# Patient Record
Sex: Female | Born: 1935 | Race: White | Hispanic: No | Smoking: Former smoker
Health system: Southern US, Community
[De-identification: ages and names within clinical notes are randomized; demographics above are authoritative.]

## PROBLEM LIST (undated history)

## (undated) DIAGNOSIS — I2510 Atherosclerotic heart disease of native coronary artery without angina pectoris: Secondary | ICD-10-CM

## (undated) DIAGNOSIS — K219 Gastro-esophageal reflux disease without esophagitis: Secondary | ICD-10-CM

## (undated) DIAGNOSIS — C801 Malignant (primary) neoplasm, unspecified: Secondary | ICD-10-CM

## (undated) DIAGNOSIS — H409 Unspecified glaucoma: Secondary | ICD-10-CM

## (undated) HISTORY — PX: TONSILLECTOMY: SUR1361

## (undated) HISTORY — PX: ABDOMINAL HYSTERECTOMY: SHX81

## (undated) HISTORY — PX: CHOLECYSTECTOMY: SHX55

---

## 2004-10-03 ENCOUNTER — Ambulatory Visit: Payer: Self-pay | Admitting: Internal Medicine

## 2005-05-18 ENCOUNTER — Emergency Department: Payer: Self-pay | Admitting: Emergency Medicine

## 2005-05-18 ENCOUNTER — Other Ambulatory Visit: Payer: Self-pay

## 2005-10-21 ENCOUNTER — Ambulatory Visit: Payer: Self-pay | Admitting: Internal Medicine

## 2006-04-09 ENCOUNTER — Ambulatory Visit: Payer: Self-pay | Admitting: Ophthalmology

## 2006-04-15 ENCOUNTER — Ambulatory Visit: Payer: Self-pay | Admitting: Ophthalmology

## 2006-10-27 ENCOUNTER — Ambulatory Visit: Payer: Self-pay | Admitting: Internal Medicine

## 2007-07-16 ENCOUNTER — Ambulatory Visit: Payer: Self-pay | Admitting: Family Medicine

## 2007-10-28 ENCOUNTER — Ambulatory Visit: Payer: Self-pay | Admitting: Internal Medicine

## 2008-09-14 ENCOUNTER — Ambulatory Visit: Payer: Self-pay | Admitting: Family Medicine

## 2008-10-19 ENCOUNTER — Ambulatory Visit: Payer: Self-pay | Admitting: Unknown Physician Specialty

## 2008-10-23 ENCOUNTER — Ambulatory Visit: Payer: Self-pay | Admitting: Unknown Physician Specialty

## 2008-10-31 ENCOUNTER — Ambulatory Visit: Payer: Self-pay | Admitting: Unknown Physician Specialty

## 2008-11-08 ENCOUNTER — Ambulatory Visit: Payer: Self-pay | Admitting: Surgery

## 2008-11-14 ENCOUNTER — Ambulatory Visit: Payer: Self-pay | Admitting: Internal Medicine

## 2008-11-15 ENCOUNTER — Inpatient Hospital Stay: Payer: Self-pay | Admitting: Surgery

## 2008-11-22 ENCOUNTER — Ambulatory Visit: Payer: Self-pay | Admitting: Surgery

## 2008-11-27 ENCOUNTER — Inpatient Hospital Stay: Payer: Self-pay | Admitting: Surgery

## 2008-12-13 ENCOUNTER — Inpatient Hospital Stay: Payer: Self-pay | Admitting: Surgery

## 2009-11-20 ENCOUNTER — Ambulatory Visit: Payer: Self-pay | Admitting: Internal Medicine

## 2010-05-09 ENCOUNTER — Ambulatory Visit: Payer: Self-pay

## 2010-12-11 ENCOUNTER — Ambulatory Visit: Payer: Self-pay | Admitting: Internal Medicine

## 2011-02-04 ENCOUNTER — Ambulatory Visit: Payer: Self-pay | Admitting: Unknown Physician Specialty

## 2011-02-10 ENCOUNTER — Ambulatory Visit: Payer: Self-pay | Admitting: Unknown Physician Specialty

## 2011-05-20 ENCOUNTER — Emergency Department: Payer: Self-pay | Admitting: Emergency Medicine

## 2011-05-20 LAB — COMPREHENSIVE METABOLIC PANEL
Albumin: 4 g/dL (ref 3.4–5.0)
Alkaline Phosphatase: 80 U/L (ref 50–136)
Anion Gap: 14 (ref 7–16)
BUN: 20 mg/dL — ABNORMAL HIGH (ref 7–18)
Bilirubin,Total: 0.4 mg/dL (ref 0.2–1.0)
Calcium, Total: 8.7 mg/dL (ref 8.5–10.1)
Chloride: 105 mmol/L (ref 98–107)
Co2: 21 mmol/L (ref 21–32)
Creatinine: 0.71 mg/dL (ref 0.60–1.30)
EGFR (Non-African Amer.): >60
Osmolality: 282 (ref 275–301)
SGPT (ALT): 46 U/L
Sodium: 140 mmol/L (ref 136–145)
Total Protein: 7.1 g/dL (ref 6.4–8.2)

## 2011-05-20 LAB — CBC
HCT: 38.9 % (ref 35.0–47.0)
HGB: 12.9 g/dL (ref 12.0–16.0)
MCV: 90 fL (ref 80–100)
Platelet: 180 10*3/uL (ref 150–440)
RDW: 14.1 % (ref 11.5–14.5)
WBC: 15 10*3/uL — ABNORMAL HIGH (ref 3.6–11.0)

## 2011-05-20 LAB — URINALYSIS, COMPLETE
Bilirubin,UR: NEGATIVE
Glucose,UR: NEGATIVE mg/dL (ref 0–75)
Ketone: NEGATIVE
RBC,UR: 2105 /HPF (ref 0–5)
Specific Gravity: 1.009 (ref 1.003–1.030)

## 2011-05-20 LAB — CK TOTAL AND CKMB (NOT AT ARMC): CK, Total: 744 U/L — ABNORMAL HIGH (ref 21–215)

## 2011-05-30 DIAGNOSIS — S62339A Displaced fracture of neck of unspecified metacarpal bone, initial encounter for closed fracture: Secondary | ICD-10-CM | POA: Insufficient documentation

## 2011-06-05 DIAGNOSIS — S12100A Unspecified displaced fracture of second cervical vertebra, initial encounter for closed fracture: Secondary | ICD-10-CM | POA: Insufficient documentation

## 2011-06-26 IMAGING — NM NUCLEAR MEDICINE HEPATOHBILIARY INCLUDE GB
2 series · 13 of 13 positions shown · non-contrast
Comparison: none

REASON FOR EXAM: abd pain   nausea   vomiting
COMMENTS:

PROCEDURE:     NM  - NM HEPATOBILIARY IMAGE  - October 23, 2008 [DATE]
RESULT:
TECHNIQUE: Hepatobiliary evaluation was performed status post intravenous
administration, left antecubital, of 8.14 mCi of Technetium 99m labeled
Choletec. Standard imaging of the abdomen was obtained with focus upon the
liver.

[Series 1000: gallbladder statics · 4.80mm/px · 2 of 2 slices shown]
[im 1/2]
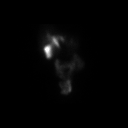
[im 2/2]
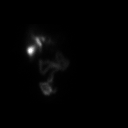

[Series 1000: gallbladder statics (concatenated) · 4.80mm/px · 11 of 11 slices shown]
[im 1/11]
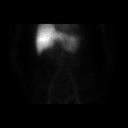
[im 2/11]
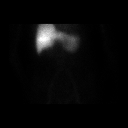
[im 3/11]
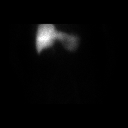
[im 4/11]
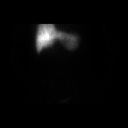
[im 5/11]
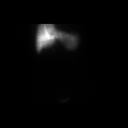
[im 6/11]
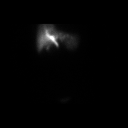
[im 7/11]
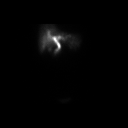
[im 8/11]
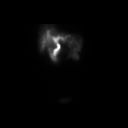
[im 9/11]
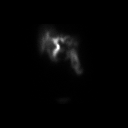
[im 10/11]
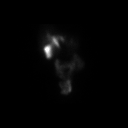
[im 11/11]
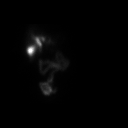

[13 of 13 positions shown; findings below may reference images not displayed]

FINDINGS: There is diffuse radiotracer activity throughout the liver. Common
bile duct activity is identified at 30 minutes with increased activity
appreciated. Small bowel activity is identified at 50 minutes. Gallbladder
excretion is identified at 80 minutes. Increased activity is identified
within the remaining portions of the study as well as peristalsis within
small bowel.
IMPRESSION: Unremarkable hepatobiliary evaluation as described above.

## 2012-11-15 ENCOUNTER — Ambulatory Visit: Payer: Self-pay | Admitting: Family Medicine

## 2013-11-10 DIAGNOSIS — K219 Gastro-esophageal reflux disease without esophagitis: Secondary | ICD-10-CM | POA: Insufficient documentation

## 2014-03-31 ENCOUNTER — Ambulatory Visit: Payer: Self-pay | Admitting: Family Medicine

## 2014-05-18 ENCOUNTER — Ambulatory Visit: Payer: Self-pay | Admitting: Family Medicine

## 2014-12-29 ENCOUNTER — Emergency Department: Payer: Medicare Other

## 2014-12-29 ENCOUNTER — Inpatient Hospital Stay
Admission: EM | Admit: 2014-12-29 | Discharge: 2015-01-01 | DRG: 389 | Disposition: A | Discharge status: Home / Self Care | Payer: Medicare Other | Attending: Surgery | Admitting: Surgery

## 2014-12-29 DIAGNOSIS — Z800 Family history of malignant neoplasm of digestive organs: Secondary | ICD-10-CM

## 2014-12-29 DIAGNOSIS — B961 Klebsiella pneumoniae [K. pneumoniae] as the cause of diseases classified elsewhere: Secondary | ICD-10-CM | POA: Diagnosis present

## 2014-12-29 DIAGNOSIS — N390 Urinary tract infection, site not specified: Secondary | ICD-10-CM | POA: Diagnosis present

## 2014-12-29 DIAGNOSIS — I2510 Atherosclerotic heart disease of native coronary artery without angina pectoris: Secondary | ICD-10-CM | POA: Diagnosis present

## 2014-12-29 DIAGNOSIS — H409 Unspecified glaucoma: Secondary | ICD-10-CM | POA: Diagnosis present

## 2014-12-29 DIAGNOSIS — Z882 Allergy status to sulfonamides status: Secondary | ICD-10-CM

## 2014-12-29 DIAGNOSIS — Z87891 Personal history of nicotine dependence: Secondary | ICD-10-CM | POA: Diagnosis not present

## 2014-12-29 DIAGNOSIS — R1110 Vomiting, unspecified: Secondary | ICD-10-CM

## 2014-12-29 DIAGNOSIS — K5669 Other intestinal obstruction: Secondary | ICD-10-CM | POA: Diagnosis present

## 2014-12-29 DIAGNOSIS — K219 Gastro-esophageal reflux disease without esophagitis: Secondary | ICD-10-CM | POA: Diagnosis present

## 2014-12-29 DIAGNOSIS — K5790 Diverticulosis of intestine, part unspecified, without perforation or abscess without bleeding: Secondary | ICD-10-CM | POA: Diagnosis present

## 2014-12-29 DIAGNOSIS — R109 Unspecified abdominal pain: Secondary | ICD-10-CM

## 2014-12-29 DIAGNOSIS — K56609 Unspecified intestinal obstruction, unspecified as to partial versus complete obstruction: Secondary | ICD-10-CM | POA: Diagnosis present

## 2014-12-29 HISTORY — DX: Unspecified glaucoma: H40.9

## 2014-12-29 HISTORY — DX: Malignant (primary) neoplasm, unspecified: C80.1

## 2014-12-29 HISTORY — DX: Gastro-esophageal reflux disease without esophagitis: K21.9

## 2014-12-29 HISTORY — DX: Atherosclerotic heart disease of native coronary artery without angina pectoris: I25.10

## 2014-12-29 LAB — URINALYSIS COMPLETE WITH MICROSCOPIC (ARMC ONLY)
BILIRUBIN URINE: NEGATIVE
GLUCOSE, UA: NEGATIVE mg/dL
KETONES UR: NEGATIVE mg/dL
NITRITE: NEGATIVE
PH: 5 (ref 5.0–8.0)
Protein, ur: NEGATIVE mg/dL
SPECIFIC GRAVITY, URINE: 1.01 (ref 1.005–1.030)

## 2014-12-29 LAB — CBC
HCT: 41.7 % (ref 35.0–47.0)
Hemoglobin: 13.7 g/dL (ref 12.0–16.0)
MCH: 29.4 pg (ref 26.0–34.0)
MCHC: 32.8 g/dL (ref 32.0–36.0)
MCV: 89.6 fL (ref 80.0–100.0)
PLATELETS: 223 10*3/uL (ref 150–440)
RBC: 4.66 MIL/uL (ref 3.80–5.20)
RDW: 14 % (ref 11.5–14.5)
WBC: 9.1 10*3/uL (ref 3.6–11.0)

## 2014-12-29 LAB — COMPREHENSIVE METABOLIC PANEL
ALT: 21 U/L (ref 14–54)
AST: 23 U/L (ref 15–41)
Albumin: 4.1 g/dL (ref 3.5–5.0)
Alkaline Phosphatase: 115 U/L (ref 38–126)
Anion gap: 9 (ref 5–15)
BILIRUBIN TOTAL: 0.6 mg/dL (ref 0.3–1.2)
BUN: 15 mg/dL (ref 6–20)
CHLORIDE: 107 mmol/L (ref 101–111)
CO2: 24 mmol/L (ref 22–32)
CREATININE: 0.82 mg/dL (ref 0.44–1.00)
Calcium: 9.3 mg/dL (ref 8.9–10.3)
Glucose, Bld: 126 mg/dL — ABNORMAL HIGH (ref 65–99)
POTASSIUM: 3.8 mmol/L (ref 3.5–5.1)
Sodium: 140 mmol/L (ref 135–145)
TOTAL PROTEIN: 7.2 g/dL (ref 6.5–8.1)

## 2014-12-29 LAB — LIPASE, BLOOD: LIPASE: 35 U/L (ref 11–51)

## 2014-12-29 MED ORDER — LATANOPROST 0.005 % OP SOLN
1.0000 [drp] | Freq: Every day | OPHTHALMIC | Status: DC
  Filled 2014-12-29: qty 2.5

## 2014-12-29 MED ORDER — ZOLPIDEM TARTRATE 5 MG PO TABS: 5.0000 mg | ORAL_TABLET | Freq: Every evening | ORAL | Status: DC | PRN

## 2014-12-29 MED ORDER — VERAPAMIL HCL ER 180 MG PO TBCR
90.0000 mg | EXTENDED_RELEASE_TABLET | Freq: Two times a day (BID) | ORAL | Status: DC
  Administered 2014-12-30 – 2015-01-01 (×6): 90 mg via ORAL
  Filled 2014-12-29 (×6): qty 0.5
  Filled 2014-12-29 (×2): qty 1

## 2014-12-29 MED ORDER — IOHEXOL 300 MG/ML  SOLN
100.0000 mL | Freq: Once | INTRAMUSCULAR | Status: AC | PRN
  Administered 2014-12-29: 100 mL via INTRAVENOUS

## 2014-12-29 MED ORDER — DORZOLAMIDE HCL 2 % OP SOLN
1.0000 [drp] | Freq: Every day | OPHTHALMIC | Status: DC
  Filled 2014-12-29: qty 10

## 2014-12-29 MED ORDER — ACETAMINOPHEN 325 MG PO TABS: 650.0000 mg | ORAL_TABLET | Freq: Four times a day (QID) | ORAL | Status: DC | PRN

## 2014-12-29 MED ORDER — ONDANSETRON 4 MG PO TBDP: 4.0000 mg | ORAL_TABLET | Freq: Four times a day (QID) | ORAL | Status: DC | PRN

## 2014-12-29 MED ORDER — KCL IN DEXTROSE-NACL 20-5-0.45 MEQ/L-%-% IV SOLN: Freq: Once | INTRAVENOUS | Status: DC

## 2014-12-29 MED ORDER — BRIMONIDINE TARTRATE-TIMOLOL 0.2-0.5 % OP SOLN: 1.0000 [drp] | Freq: Every day | OPHTHALMIC | Status: DC

## 2014-12-29 MED ORDER — IOHEXOL 240 MG/ML SOLN
25.0000 mL | Freq: Once | INTRAMUSCULAR | Status: AC | PRN
  Administered 2014-12-29: 25 mL via ORAL

## 2014-12-29 MED ORDER — ONDANSETRON HCL 4 MG/2ML IJ SOLN: 4.0000 mg | Freq: Four times a day (QID) | INTRAMUSCULAR | Status: DC | PRN

## 2014-12-29 MED ORDER — KCL IN DEXTROSE-NACL 20-5-0.45 MEQ/L-%-% IV SOLN
INTRAVENOUS | Status: DC
  Administered 2014-12-30 – 2014-12-31 (×2): via INTRAVENOUS
  Filled 2014-12-29 (×5): qty 1000

## 2014-12-29 MED ORDER — ACETAMINOPHEN 650 MG RE SUPP: 650.0000 mg | Freq: Four times a day (QID) | RECTAL | Status: DC | PRN

## 2014-12-29 NOTE — H&P (Signed)
history and physical Chief complaint is vomiting Alicia Patterson is a 81 y.o. female   History of Present Illness: she reports several years of intermittent episodes of vomiting.  She reports that the last few days she has had some nausea.  Last night she vomited and then vomited again during the night and again at 9 o'clock this morning.  She reports also some moderate right upper abdominal discomfort.  She reports no chills or fever.  She has had good bowel movements today and no recent abnormalities with bowel movements.  She reports no persistent nausea at present.  She went into a walk-in clinic and was referred to the emergency room.  In the emergency room she had a CT scan which I have reviewed the images which demonstrate  dilated small bowel distal loops are not dilated.  There is also evidence of diverticulosis.  She was referred by the emergency room for admission to the hospital.  Past Medical History:   She is not aware of any history of coronary artery disease but does reports he has had some a rhythm and in the past.  Problem List: There are no active problems to display for this patient.    Past medical history includes gastroesophageal reflux She reports a history of a arrhythmia.  She is not aware of any history of coronary artery disease but her record does reflect a history of coronary artery disease.  She reports no history of myocardial infarction   Past Surgical History: in  2010 she had laparoscopic cholecystectomy with intraoperative cholangiogram findings of choledocholithiasis.  She had an attempted ERCP but failed.  She subsequently had an open choledocholithotomy Past Surgical History  Procedure Laterality Date  . Cholecystectomy    . Tonsillectomy    . Abdominal hysterectomy      Allergies: Allergies  Allergen Reactions  . Sulfa Antibiotics Rash    Home Medications: Verapamil 180 mg 1/2 tablet 2 times per day   omeprazole 20 mg daily  Family History:She  reports 2 sisters have had stomach cancer family history is not on file.  The patient's family history is negative for inflammatory bowel disorders, GI malignancy, or solid organ transplantation.  Social History:  She does not smoke and does not drink any alcohol  reports that she has quit smoking. She does not have any smokeless tobacco history on file. She reports that she does not drink alcohol or use illicit drugs. The patient denies ETOH, tobacco, or drug use.   Review of Systems: she reports no recent acute illness such as cough cold or sore throat.  She reports no recent visual changes.  Has had some occasional right side and headaches she reports no difficulty swallowing.  Does report a burping and heartburn.  She reports no chest pains.  No dyspnea on exertion.  She says she had can walk quickly and can easily go up a flight of steps.  She reports she is moving her bowels satisfactorily and has had no rectal bleeding.  She reports she does have bladder prolapse and has had some minor discomfort with emptying her bladder.  She reports no ankle edema and no recent sores or boils.  Review of systems otherwise negative.   Physical Examination: BP 174/82 mmHg  Pulse 68  Temp(Src) 98.1 F (36.7 C) (Oral)  Resp 18  Ht 5\' 4"  (1.626 m)  Wt 68.04 kg (150 lb)  BMI 25.73 kg/m2  SpO2 98%   GENERAL:  The patient is awake,  alert and oriented, ambulatory and in no acute distress.  HEENT:    Pupils equal, reactive to light, extraocular movements are intact, sclerae are clear, palpebral conjunctiva normal red color.  Pharynx clear.  NECK:   Supple without adenopathy or palpable mass.  LUNGS:   Patient is in no respiratory distress.  Lungs are clear without rales rhonchi or wheezes.   HEART:   Regular rhythm,  normal S1-S2 without murmur.  ABDOMEN :Appears mildly distended.  There is minimal degree of tenderness on deeper palpation, no palpable mass  RECTAL EXAM:  There was no external  appearance of  dermatitis.  Digital exam demonstrated presence of sphincter tone.  There was no unusual tenderness and no palpable rectal mass.  EXTREMITIES: Well developed well-nourished and with no dependent edema.  NEUROLOGIC:  The patient has a symmetrical facial expression and is awake alert and oriented and moving all extremities. Data: Lab Results  Component Value Date   WBC 9.1 12/29/2014   HGB 13.7 12/29/2014   HCT 41.7 12/29/2014   MCV 89.6 12/29/2014   PLT 223 12/29/2014    Recent Labs Lab 12/29/14 1235  HGB 13.7   Lab Results  Component Value Date   NA 140 12/29/2014   K 3.8 12/29/2014   CL 107 12/29/2014   CO2 24 12/29/2014   BUN 15 12/29/2014   CREATININE 0.82 12/29/2014   Lab Results  Component Value Date   ALT 21 12/29/2014   AST 23 12/29/2014   ALKPHOS 115 12/29/2014   BILITOT 0.6 12/29/2014   No results for input(s): APTT, INR, PTT in the last 168 hours. Assessment/Plan:Partial small-bowel obstruction   Recommendations: plan is to admit her to the hospital  And treated with IV fluids and bowel rest.  Plan and flat and upright x-ray of the abdomen in the morning.  I have discussed the possibilities of needing intestinal surgery.  The surgery is not recommended at present.  Also plan urine culture  Thank you for the consult. Please call with questions or concerns.  Rochel Brome, MD   GI Inpatient Consult Note  Reason for Consult:   Attending Requesting Consult:  History of Present Illness: Alicia Patterson is a 81 y.o. female  Past Medical History:  Past Medical History  Diagnosis Date  . GERD (gastroesophageal reflux disease)   . Coronary artery disease   . Glaucoma   . Cancer [facility])     Problem List: There are no active problems to display for this patient.   Past Surgical History: Past Surgical History  Procedure Laterality Date  . Cholecystectomy    . Tonsillectomy    . Abdominal hysterectomy      Allergies: Allergies   Allergen Reactions  . Sulfa Antibiotics Rash    Home Medications:  (Not in a hospital admission) Home medication reconciliation was completed with the patient.   Scheduled Inpatient Medications:    Continuous Inpatient Infusions:    PRN Inpatient Medications:    Family History: family history is not on file.  The patient's family history is negative for inflammatory bowel disorders, GI malignancy, or solid organ transplantation.  Social History:   reports that she has quit smoking. She does not have any smokeless tobacco history on file. She reports that she does not drink alcohol or use illicit drugs. The patient denies ETOH, tobacco, or drug use.   Review of Systems: Constitutional: Weight is stable.  Eyes: No changes in vision. ENT: No oral lesions, sore throat.  GI: see  HPI.  Heme/Lymph: No easy bruising.  CV: No chest pain.  GU: No hematuria.  Integumentary: No rashes.  Neuro: No headaches.  Psych: No depression/anxiety.  Endocrine: No heat/cold intolerance.  Allergic/Immunologic: No urticaria.  Resp: No cough, SOB.  Musculoskeletal: No joint swelling.    Physical Examination: BP 174/82 mmHg  Pulse 68  Temp(Src) 98.1 F (36.7 C) (Oral)  Resp 18  Ht 5\' 4"  (1.626 m)  Wt 68.04 kg (150 lb)  BMI 25.73 kg/m2  SpO2 98% Gen: NAD, alert and oriented x 4 HEENT: PEERLA, EOMI, Neck: supple, no JVD or thyromegaly Chest: CTA bilaterally, no wheezes, crackles, or other adventitious sounds CV: RRR, no m/g/c/r Abd: soft, NT, ND, +BS in all four quadrants; no HSM, guarding, ridigity, or rebound tenderness Ext: no edema, well perfused with 2+ pulses, Skin: no rash or lesions noted Lymph: no LAD  Data: Lab Results  Component Value Date   WBC 9.1 12/29/2014   HGB 13.7 12/29/2014   HCT 41.7 12/29/2014   MCV 89.6 12/29/2014   PLT 223 12/29/2014    Recent Labs Lab 12/29/14 1235  HGB 13.7   Lab Results  Component Value Date   NA 140 12/29/2014   K 3.8  12/29/2014   CL 107 12/29/2014   CO2 24 12/29/2014   BUN 15 12/29/2014   CREATININE 0.82 12/29/2014   Lab Results  Component Value Date   ALT 21 12/29/2014   AST 23 12/29/2014   ALKPHOS 115 12/29/2014   BILITOT 0.6 12/29/2014   No results for input(s): APTT, INR, PTT in the last 168 hours. Assessment/Plan: Ms. Sifuentes is a 81 y.o. female   Recommendations:  Thank you for the consult. Please call with questions or concerns.  Rochel Brome, MD

## 2014-12-29 NOTE — ED Notes (Addendum)
C/o RUQ pain off and on since cholecystectomy in 2010.   She also c/o pain that she gets to her head occasionally that goes away after about 2 minutes and has several pain attacks to her head 3-4x per day. She first noticed her head pain 2-3 weeks ago. She also c/o right flank pain and tenderness. Pt also has a prolapse bladder and they are unable to do any surgery.

## 2014-12-29 NOTE — ED Notes (Addendum)
Pt c/o RUQ pain with N/V/D since 3am this morning, states she has had these sx intermittent since having her gall bladder removed in 2010 and has followed up with her PCP/surgeon without any dx for the recurrent sx.Marland Kitchen

## 2014-12-29 NOTE — ED Provider Notes (Signed)
[facility] Emergency Department Provider Note  ____________________________________________  Time seen: Approximately 8:30 PM  I have reviewed the triage vital signs and the nursing notes.   HISTORY  Chief Complaint Abdominal Pain   HPI Alicia Patterson is a 81 y.o. female patient sent from critical clinic for repeated episodes of vomiting. Patient reports she has this for 5 times in the past few years since she had her gallbladder out. Patient has some pain in the right upper quadrant. Today the patient is complaining moderate pain and intractable vomiting. She went to urgent care note clinic and was sent here. Patient also complains of unilateral headache occasionally. Patient also complains of some blurry vision on occasion. Vision is not blurry at present. Headache and vomiting are not associated with each other.   Past Medical History  Diagnosis Date  . GERD (gastroesophageal reflux disease)   . Coronary artery disease   . Glaucoma   . Cancer ([place])     There are no active problems to display for this patient.   Past Surgical History  Procedure Laterality Date  . Cholecystectomy    . Tonsillectomy    . Abdominal hysterectomy      No current outpatient prescriptions on file.  Allergies Sulfa antibiotics  No family history on file.  Social History Social History  Substance Use Topics  . Smoking status: Former Research scientist (life sciences)  . Smokeless tobacco: None  . Alcohol Use: No    Review of Systems Constitutional: No fever/chills Eyes: See history of present illness ENT: No sore throat. Cardiovascular: Denies chest pain. Respiratory: Denies shortness of breath. Gastrointestinal: See history of present illness No diarrhea.  No constipation. Genitourinary: Negative for dysuria. Musculoskeletal: Negative for back pain. Skin: Negative for rash. Neurological: See history of present illness focal weakness or numbness.  10-point ROS otherwise  negative.  ____________________________________________   PHYSICAL EXAM:  VITAL SIGNS: ED Triage Vitals  Enc Vitals Group     BP 12/29/14 1229 132/85 mmHg     Pulse Rate 12/29/14 1229 65     Resp 12/29/14 1229 18     Temp 12/29/14 1229 98.1 F (36.7 C)     Temp Source 12/29/14 1229 Oral     SpO2 12/29/14 1229 96 %     Weight 12/29/14 1229 150 lb (68.04 kg)     Height 12/29/14 1229 5\' 4"  (1.626 m)     Head Cir --      Peak Flow --      Pain Score 12/29/14 1230 4     Pain Loc --      Pain Edu? --      Excl. in [place]? --     Constitutional: Alert and oriented. Well appearing and in no acute distress. Eyes: Conjunctivae are normal. PERRL. EOMI. fundi appear to be normal although exam is not the best Head: Atraumatic. Nose: No congestion/rhinnorhea. Mouth/Throat: Mucous membranes are moist.  Oropharynx non-erythematous. Neck: No stridor. Cardiovascular: Normal rate, regular rhythm. Grossly normal heart sounds.  Good peripheral circulation. Respiratory: Normal respiratory effort.  No retractions. Lungs CTAB. Gastrointestinal: Soft some right upper quadrant tenderness bowel sounds are decreased. No distention. No abdominal bruits. No CVA tenderness. Musculoskeletal: No lower extremity tenderness nor edema.  No joint effusions. Neurologic:  Normal speech and language. No gross focal neurologic deficits are appreciated. No gait instability. Skin:  Skin is warm, dry and intact. No rash noted. Psychiatric: Mood and affect are normal. Speech and behavior are normal.  ____________________________________________  LABS (all labs ordered are listed, but only abnormal results are displayed)  Labs Reviewed  COMPREHENSIVE METABOLIC PANEL - Abnormal; Notable for the following:    Glucose, Bld 126 (*)    All other components within normal limits  URINALYSIS COMPLETEWITH MICROSCOPIC ([facility] ONLY) - Abnormal; Notable for the following:    Color, Urine YELLOW (*)    APPearance CLOUDY (*)     Hgb urine dipstick 1+ (*)    Leukocytes, UA 2+ (*)    Bacteria, UA MANY (*)    Squamous Epithelial / LPF 6-30 (*)    All other components within normal limits  LIPASE, BLOOD  CBC   ____________________________________________  EKG  Not available ____________________________________________  RADIOLOGY  Abdominal CT is read by radiology showing mechanical small bowel obstruction Head CT is read as normal ____________________________________________   PROCEDURES    ____________________________________________   INITIAL IMPRESSION / ASSESSMENT AND PLAN / ED COURSE  Pertinent labs & imaging results that were available during my care of the patient were reviewed by me and considered in my medical decision making (see chart for details).   ____________________________________________   FINAL CLINICAL IMPRESSION(S) / ED DIAGNOSES  Final diagnoses:  Small bowel obstruction ([place])      Nena Polio, MD 12/29/14 2033

## 2014-12-30 ENCOUNTER — Inpatient Hospital Stay: Payer: Medicare Other

## 2014-12-30 MED ORDER — BRIMONIDINE TARTRATE 0.2 % OP SOLN
1.0000 [drp] | Freq: Two times a day (BID) | OPHTHALMIC | Status: DC
  Filled 2014-12-30: qty 5

## 2014-12-30 MED ORDER — TIMOLOL MALEATE 0.5 % OP SOLN
1.0000 [drp] | Freq: Two times a day (BID) | OPHTHALMIC | Status: DC
  Filled 2014-12-30: qty 5

## 2014-12-30 NOTE — Progress Notes (Signed)
She was admitted last night with partial small bowel obstruction. She does report some persistent nausea which is mild. She reports no vomiting since admission. She has had 3 bowel movements since admission and passed small amount of flatus. She has moderate abdominal discomfort more on the right side. She has not taken any Tylenol since admission. She is voiding satisfactorily without dysuria.  Patient reports she reports not to take the hospital eyedrops but much prefers to take her on eyedrops which she has here with her. I noted her history of glaucoma.  Past medical history noted does have history of gastroesophageal reflux and history of arrhythmia  Past surgical history was reviewed. She has had previous laparoscopic cholecystectomy and open choledochal lithotomy and hysterectomy in 1 ovary removed  Review of systems otherwise negative, no headache   GENERAL:  Awake alert and oriented and in no acute distress. Blood pressure 130/61, pulse 69, respiratory rate 18, oxygen saturation 100%  LUNGS:  Clear without rales rhonchi or wheezes.  HEART:  Regular rhythm S1-S2, without murmur.  Abdomen: Does not appear to be distended. It is soft with minor tenderness more on the right side. No palpable mass no guarding  I reviewed her flat and upright x-ray images of the abdomen. These images to demonstrate that the dye ingested prior to CT scan is now in her colon. There are several mildly dilated loops of small bowel. The least one air-fluid level seen. X-ray appears improved over the CT images.  Urine culture is pending  Impression improvement with partial small bowel obstruction, bacteriuria, glaucoma  Plan is to have her take her on eyedrops. Began a clear liquid diet and taper the IV. Advance as tolerated to full liquids. Ambulate in the hallway. Anticipate urine culture report

## 2014-12-31 MED ORDER — CIPROFLOXACIN HCL 500 MG PO TABS
500.0000 mg | ORAL_TABLET | Freq: Two times a day (BID) | ORAL | Status: DC
  Administered 2014-12-31 – 2015-01-01 (×3): 500 mg via ORAL
  Filled 2014-12-31 (×3): qty 1

## 2014-12-31 NOTE — Progress Notes (Signed)
chief complaint today is pink urine she reports no dysuria, no fever. No oral intake was recorded for the last 24 hours. She however reports she has taken 100% of her clear liquid diet. She has not yet had any full liquids. She did have 2 bowel movements this morning. She reports moderate right mid abdominal discomfort. She reports no nausea or vomiting. She reports she is walking in the hallway.  Past medical history: She reports she has a history of prolapsed bladder and has been told she is too old to have it repaired. She reports she has had at least 7 urinary tract infections this year.  She is taking her own glaucoma drops  Vital signs are stable, afebrile GENERAL:  Awake alert and oriented and in no acute distress. Abdomen with moderate right mid abdominal tenderness on deeper palpation. No palpable mass  I called the laboratory microbiology department and obtained a verbal report of greater than 100 colonies of lactose fermenter gram-negative rod growing in her urine culture  Impression improvement with partial small bowel obstruction,  urinary tract infection  Plan is to advance to full liquid diet and see how she tolerates that. Continue IV fluid at present. Prescribed Cipro 500 mg twice a day and await final urine culture report

## 2015-01-01 LAB — URINE CULTURE: SPECIAL REQUESTS: NORMAL

## 2015-01-01 MED ORDER — CIPROFLOXACIN HCL 500 MG PO TABS: 500.0000 mg | ORAL_TABLET | Freq: Two times a day (BID) | ORAL | Status: DC

## 2015-01-01 NOTE — Care Management Important Message (Signed)
Important Message  Patient Details  Name: Alicia Patterson MRN: [id number] Date of Birth: 07/22/1930   Medicare Important Message Given:  Yes-second notification given    Juliann Pulse A Ruthanne Mcneish 01/01/2015, 10:02 AM

## 2015-01-01 NOTE — Discharge Instructions (Signed)
Gradually advance diet and chew thoroughly and eat slowly.  Follow up with Dr. Tamala Julian at the [facility] surgery department in 2 weeks.  Take Cipro 500 mg 2 times per day

## 2015-01-01 NOTE — Discharge Summary (Signed)
Physician Discharge Summary  Patient ID: Alicia Patterson MRN: [id number] DOB/AGE: [birth year] 81 y.o.  Admit date: 12/29/2014 Discharge date: 01/01/2015  Admission Diagnoses: partial small-bowel obstruction  Discharge Diagnoses:  Partial small-bowel obstruction, urinary tract infection Active Problems:   Small bowel obstruction [facility])   Discharged Condition:  good  Hospital Course:  Her follow-up plain x-ray demonstrated CT dye had passed through to the colon.  She did have a number of bowel movements while in the hospital.  She began a clear liquid diet and later advanced to full liquid diet which she has tolerated fairly well.  Consults:   None  Significant Diagnostic Studies:  CT scan with evidence of partial small-bowel obstruction.  Urine culture with greater than 100,000 colonies per mL of g negative rod pending  Treatments:  Cipro 500 mg 2 times per day  Discharge Exam: Blood pressure 129/66, pulse 55, temperature 97.7 F (36.5 C), temperature source Oral, resp. rate 18, height 5\' 4"  (1.626 m), weight 61.735 kg (136 lb 1.6 oz), SpO2 98 %.   GENERAL:  The patient is awake, alert and oriented, ambulatory and in no acute distress.  LUNGS:   Patient is in no respiratory distress.  Lungs are clear without rales rhonchi or wheezes.   HEART:   Regular rhythm,  normal S1-S2 without murmur.  Abdomen with mild right mid abdominal tenderness.  No palpable mass  Disposition:  discharge to home.  Follow-up in the office in 2 weeks     Medication List    TAKE these medications        aspirin EC 81 MG tablet  Take 1 tablet by mouth daily.     ciprofloxacin 500 MG tablet  Commonly known as:  CIPRO  Take 1 tablet (500 mg total) by mouth 2 (two) times daily.     COMBIGAN 0.2-0.5 % ophthalmic solution  Generic drug:  brimonidine-timolol  Place 1 drop into both eyes daily.     dorzolamide 2 % ophthalmic solution  Commonly known as:  TRUSOPT  Place 1 drop into both eyes daily.      latanoprost 0.005 % ophthalmic solution  Commonly known as:  XALATAN  Place 1 drop into both eyes daily.     omeprazole 20 MG capsule  Commonly known as:  PRILOSEC  Take 1 capsule by mouth daily.     verapamil 180 MG CR tablet  Commonly known as:  CALAN-SR  Take 1 tablet by mouth daily.         Signed:  Rochel Brome 01/01/2015, 8:07 AM

## 2015-01-01 NOTE — Progress Notes (Signed)
01/01/2015] 11:55 AM Denna Haggard to be D/C'd Home per MD order.  Discussed prescriptions and follow up appointments with the patient. Prescriptions given to patient, medication list explained in detail. Pt verbalized understanding.    Medication List    TAKE these medications        aspirin EC 81 MG tablet  Take 1 tablet by mouth daily.     ciprofloxacin 500 MG tablet  Commonly known as:  CIPRO  Take 1 tablet (500 mg total) by mouth 2 (two) times daily.     ciprofloxacin 500 MG tablet  Commonly known as:  CIPRO  Take 1 tablet (500 mg total) by mouth 2 (two) times daily.     COMBIGAN 0.2-0.5 % ophthalmic solution  Generic drug:  brimonidine-timolol  Place 1 drop into both eyes daily.     dorzolamide 2 % ophthalmic solution  Commonly known as:  TRUSOPT  Place 1 drop into both eyes daily.     latanoprost 0.005 % ophthalmic solution  Commonly known as:  XALATAN  Place 1 drop into both eyes daily.     omeprazole 20 MG capsule  Commonly known as:  PRILOSEC  Take 1 capsule by mouth daily.     verapamil 180 MG CR tablet  Commonly known as:  CALAN-SR  Take 1 tablet by mouth daily.        Filed Vitals:   01/01/15 0530  BP: 129/66  Pulse: 55  Temp: 97.7 F (36.5 C)  Resp: 18    Skin clean, dry and intact without evidence of skin break down, no evidence of skin tears noted. IV catheter discontinued intact. Site without signs and symptoms of complications. Dressing and pressure applied. Pt denies pain at this time. No complaints noted.  An After Visit Summary was printed and given to the patient. Patient escorted via [place], and D/C home via private auto.  Dola Argyle

## 2015-01-22 ENCOUNTER — Other Ambulatory Visit: Payer: Self-pay | Admitting: Surgery

## 2015-01-22 DIAGNOSIS — R112 Nausea with vomiting, unspecified: Secondary | ICD-10-CM

## 2015-01-26 ENCOUNTER — Ambulatory Visit
Admission: RE | Admit: 2015-01-26 | Discharge: 2015-01-26 | Disposition: A | Discharge status: Home / Self Care | Payer: Medicare Other | Source: Ambulatory Visit | Attending: Surgery | Admitting: Surgery

## 2015-01-26 DIAGNOSIS — K5710 Diverticulosis of small intestine without perforation or abscess without bleeding: Secondary | ICD-10-CM | POA: Insufficient documentation

## 2015-01-26 DIAGNOSIS — R112 Nausea with vomiting, unspecified: Secondary | ICD-10-CM | POA: Diagnosis present

## 2015-03-20 DIAGNOSIS — N390 Urinary tract infection, site not specified: Secondary | ICD-10-CM | POA: Insufficient documentation

## 2015-06-25 ENCOUNTER — Other Ambulatory Visit: Payer: Self-pay | Admitting: Family Medicine

## 2015-06-25 DIAGNOSIS — Z1231 Encounter for screening mammogram for malignant neoplasm of breast: Secondary | ICD-10-CM

## 2015-07-04 ENCOUNTER — Other Ambulatory Visit: Payer: Self-pay | Admitting: Family Medicine

## 2015-07-04 ENCOUNTER — Ambulatory Visit
Admission: RE | Admit: 2015-07-04 | Discharge: 2015-07-04 | Disposition: A | Discharge status: Home / Self Care | Payer: Medicare Other | Source: Ambulatory Visit | Attending: Family Medicine | Admitting: Family Medicine

## 2015-07-04 DIAGNOSIS — Z1231 Encounter for screening mammogram for malignant neoplasm of breast: Secondary | ICD-10-CM

## 2016-05-05 ENCOUNTER — Other Ambulatory Visit: Payer: Self-pay | Admitting: Student

## 2016-05-05 DIAGNOSIS — N6320 Unspecified lump in the left breast, unspecified quadrant: Secondary | ICD-10-CM

## 2016-05-14 ENCOUNTER — Ambulatory Visit
Admission: RE | Admit: 2016-05-14 | Discharge: 2016-05-14 | Disposition: A | Discharge status: Home / Self Care | Payer: Medicare Other | Source: Ambulatory Visit | Attending: Student | Admitting: Student

## 2016-05-14 DIAGNOSIS — N6320 Unspecified lump in the left breast, unspecified quadrant: Secondary | ICD-10-CM | POA: Insufficient documentation

## 2016-05-15 ENCOUNTER — Other Ambulatory Visit: Payer: Self-pay | Admitting: Student

## 2016-05-15 DIAGNOSIS — N6320 Unspecified lump in the left breast, unspecified quadrant: Secondary | ICD-10-CM

## 2016-05-15 DIAGNOSIS — R928 Other abnormal and inconclusive findings on diagnostic imaging of breast: Secondary | ICD-10-CM

## 2016-05-22 ENCOUNTER — Ambulatory Visit
Admission: RE | Admit: 2016-05-22 | Discharge: 2016-05-22 | Disposition: A | Discharge status: Home / Self Care | Payer: Medicare Other | Source: Ambulatory Visit | Attending: Student | Admitting: Student

## 2016-05-22 DIAGNOSIS — N6320 Unspecified lump in the left breast, unspecified quadrant: Secondary | ICD-10-CM

## 2016-05-22 DIAGNOSIS — N641 Fat necrosis of breast: Secondary | ICD-10-CM | POA: Insufficient documentation

## 2016-05-22 DIAGNOSIS — R928 Other abnormal and inconclusive findings on diagnostic imaging of breast: Secondary | ICD-10-CM

## 2016-05-22 DIAGNOSIS — N6322 Unspecified lump in the left breast, upper inner quadrant: Secondary | ICD-10-CM | POA: Diagnosis present

## 2016-05-22 HISTORY — PX: BREAST BIOPSY: SHX20

## 2016-05-23 LAB — SURGICAL PATHOLOGY

## 2016-10-09 ENCOUNTER — Other Ambulatory Visit: Payer: Self-pay | Admitting: Family Medicine

## 2016-10-09 DIAGNOSIS — N6320 Unspecified lump in the left breast, unspecified quadrant: Secondary | ICD-10-CM

## 2016-11-28 ENCOUNTER — Ambulatory Visit
Admission: RE | Admit: 2016-11-28 | Discharge: 2016-11-28 | Disposition: A | Discharge status: Home / Self Care | Payer: Medicare Other | Source: Ambulatory Visit | Attending: Family Medicine | Admitting: Family Medicine

## 2016-11-28 DIAGNOSIS — N641 Fat necrosis of breast: Secondary | ICD-10-CM | POA: Insufficient documentation

## 2016-11-28 DIAGNOSIS — R921 Mammographic calcification found on diagnostic imaging of breast: Secondary | ICD-10-CM | POA: Insufficient documentation

## 2016-11-28 DIAGNOSIS — N6320 Unspecified lump in the left breast, unspecified quadrant: Secondary | ICD-10-CM

## 2016-12-14 ENCOUNTER — Emergency Department: Payer: Medicare Other

## 2016-12-14 ENCOUNTER — Encounter: Payer: Self-pay | Admitting: Emergency Medicine

## 2016-12-14 ENCOUNTER — Inpatient Hospital Stay
Admission: EM | Admit: 2016-12-14 | Discharge: 2016-12-15 | DRG: 280 | Disposition: A | Discharge status: Other Acute Inpatient Hospital | Payer: Medicare Other | Attending: Internal Medicine | Admitting: Internal Medicine

## 2016-12-14 DIAGNOSIS — Z803 Family history of malignant neoplasm of breast: Secondary | ICD-10-CM

## 2016-12-14 DIAGNOSIS — K219 Gastro-esophageal reflux disease without esophagitis: Secondary | ICD-10-CM | POA: Diagnosis present

## 2016-12-14 DIAGNOSIS — Z87891 Personal history of nicotine dependence: Secondary | ICD-10-CM

## 2016-12-14 DIAGNOSIS — Z634 Disappearance and death of family member: Secondary | ICD-10-CM

## 2016-12-14 DIAGNOSIS — I471 Supraventricular tachycardia: Secondary | ICD-10-CM | POA: Diagnosis present

## 2016-12-14 DIAGNOSIS — I214 Non-ST elevation (NSTEMI) myocardial infarction: Secondary | ICD-10-CM | POA: Diagnosis present

## 2016-12-14 DIAGNOSIS — H409 Unspecified glaucoma: Secondary | ICD-10-CM | POA: Diagnosis present

## 2016-12-14 DIAGNOSIS — Z882 Allergy status to sulfonamides status: Secondary | ICD-10-CM

## 2016-12-14 DIAGNOSIS — Z880 Allergy status to penicillin: Secondary | ICD-10-CM

## 2016-12-14 DIAGNOSIS — Z809 Family history of malignant neoplasm, unspecified: Secondary | ICD-10-CM

## 2016-12-14 DIAGNOSIS — F419 Anxiety disorder, unspecified: Secondary | ICD-10-CM | POA: Diagnosis present

## 2016-12-14 DIAGNOSIS — Z888 Allergy status to other drugs, medicaments and biological substances status: Secondary | ICD-10-CM

## 2016-12-14 DIAGNOSIS — Z9049 Acquired absence of other specified parts of digestive tract: Secondary | ICD-10-CM

## 2016-12-14 DIAGNOSIS — G4700 Insomnia, unspecified: Secondary | ICD-10-CM | POA: Diagnosis present

## 2016-12-14 DIAGNOSIS — Z79899 Other long term (current) drug therapy: Secondary | ICD-10-CM

## 2016-12-14 DIAGNOSIS — Z90710 Acquired absence of both cervix and uterus: Secondary | ICD-10-CM

## 2016-12-14 DIAGNOSIS — J810 Acute pulmonary edema: Secondary | ICD-10-CM | POA: Diagnosis present

## 2016-12-14 DIAGNOSIS — Z7982 Long term (current) use of aspirin: Secondary | ICD-10-CM

## 2016-12-14 DIAGNOSIS — I2510 Atherosclerotic heart disease of native coronary artery without angina pectoris: Secondary | ICD-10-CM | POA: Diagnosis present

## 2016-12-14 LAB — PROTIME-INR
INR: 1.11
PROTHROMBIN TIME: 14.2 s (ref 11.4–15.2)

## 2016-12-14 LAB — BASIC METABOLIC PANEL
ANION GAP: 9 (ref 5–15)
BUN: 15 mg/dL (ref 6–20)
CO2: 23 mmol/L (ref 22–32)
Calcium: 8.4 mg/dL — ABNORMAL LOW (ref 8.9–10.3)
Chloride: 101 mmol/L (ref 101–111)
Creatinine, Ser: 1.02 mg/dL — ABNORMAL HIGH (ref 0.44–1.00)
GFR, EST AFRICAN AMERICAN: 57 mL/min — AB (ref >60)
GFR, EST NON AFRICAN AMERICAN: 49 mL/min — AB (ref >60)
Glucose, Bld: 104 mg/dL — ABNORMAL HIGH (ref 65–99)
POTASSIUM: 4.1 mmol/L (ref 3.5–5.1)
SODIUM: 133 mmol/L — AB (ref 135–145)

## 2016-12-14 LAB — CBC
HCT: 36.6 % (ref 35.0–47.0)
HEMOGLOBIN: 12.2 g/dL (ref 12.0–16.0)
MCH: 30.3 pg (ref 26.0–34.0)
MCHC: 33.4 g/dL (ref 32.0–36.0)
MCV: 90.8 fL (ref 80.0–100.0)
Platelets: 208 10*3/uL (ref 150–440)
RBC: 4.03 MIL/uL (ref 3.80–5.20)
RDW: 14.2 % (ref 11.5–14.5)
WBC: 13.6 10*3/uL — ABNORMAL HIGH (ref 3.6–11.0)

## 2016-12-14 LAB — APTT: aPTT: 26 seconds (ref 24–36)

## 2016-12-14 LAB — TROPONIN I: TROPONIN I: 1.96 ng/mL — AB (ref <0.03)

## 2016-12-14 LAB — BRAIN NATRIURETIC PEPTIDE: B Natriuretic Peptide: 588 pg/mL — ABNORMAL HIGH (ref 0.0–100.0)

## 2016-12-14 MED ORDER — MORPHINE SULFATE (PF) 2 MG/ML IV SOLN
INTRAVENOUS | Status: AC
  Administered 2016-12-14: 1 mg via INTRAVENOUS
  Filled 2016-12-14: qty 1

## 2016-12-14 MED ORDER — MORPHINE SULFATE (PF) 2 MG/ML IV SOLN
1.0000 mg | Freq: Once | INTRAVENOUS | Status: AC
  Administered 2016-12-14: 1 mg via INTRAVENOUS

## 2016-12-14 MED ORDER — HEPARIN SODIUM (PORCINE) 5000 UNIT/ML IJ SOLN
60.0000 [IU]/kg | Freq: Once | INTRAMUSCULAR | Status: AC
  Administered 2016-12-14: 3500 [IU] via INTRAVENOUS
  Filled 2016-12-14: qty 1

## 2016-12-14 MED ORDER — ASPIRIN 81 MG PO CHEW
CHEWABLE_TABLET | ORAL | Status: AC
  Administered 2016-12-14: 327 mg via ORAL
  Filled 2016-12-14: qty 3

## 2016-12-14 MED ORDER — HEPARIN (PORCINE) IN NACL 100-0.45 UNIT/ML-% IJ SOLN
600.0000 [IU]/h | Freq: Once | INTRAMUSCULAR | Status: AC
  Administered 2016-12-14: 600 [IU]/h via INTRAVENOUS
  Filled 2016-12-14: qty 250

## 2016-12-14 MED ORDER — IOPAMIDOL (ISOVUE-370) INJECTION 76%
75.0000 mL | Freq: Once | INTRAVENOUS | Status: AC | PRN
  Administered 2016-12-14: 75 mL via INTRAVENOUS

## 2016-12-14 MED ORDER — ONDANSETRON HCL 4 MG/2ML IJ SOLN
4.0000 mg | Freq: Once | INTRAMUSCULAR | Status: AC
  Administered 2016-12-14: 4 mg via INTRAVENOUS
  Filled 2016-12-14: qty 2

## 2016-12-14 NOTE — ED Notes (Signed)
Pt sleeping quietly. Family at bedisde.

## 2016-12-14 NOTE — ED Triage Notes (Signed)
Husband died yesterday. Today she is having cp with deep breath, tingling in hands. Felt her heart was racing so she took 2 or 3 verapamil, hands are shaking and she states she hasn't eaten or slept today

## 2016-12-14 NOTE — Progress Notes (Addendum)
ANTICOAGULATION CONSULT NOTE - Initial Consult  Pharmacy Consult for heparin gtt Indication: chest pain/ACS  Allergies  Allergen Reactions  . Sulfa Antibiotics Rash    Patient Measurements: Height: 5' 3.5" (161.3 cm) Weight: 129 lb (58.5 kg) IBW/kg (Calculated) : 53.55 Heparin Dosing Weight: 58.5kg  Vital Signs: Temp: 98.2 F (36.8 C) (10/14 1838) BP: 101/65 (10/14 2100) Pulse Rate: 98 (10/14 2100)  Labs:  Recent Labs  12/14/16 1844  HGB 12.2  HCT 36.6  PLT 208  CREATININE 1.02*  TROPONINI 1.96*    Estimated Creatinine Clearance: 34.7 mL/min (A) (by C-G formula based on SCr of 1.02 mg/dL (H)).   Medical History: Past Medical History:  Diagnosis Date  . Cancer ([place])   . Coronary artery disease   . GERD (gastroesophageal reflux disease)   . Glaucoma     Medications:   (Not in a hospital admission) Scheduled:  . heparin  60 Units/kg Intravenous Once   Infusions:  . heparin     PRN:  Anti-infectives    None      Assessment: 83 year old female with ACS/NSTEMI, requiring heparin gtt with bolus per pharmacy protocol.   Goal of Therapy:  Heparin level 0.3-0.7 units/ml Monitor platelets by anticoagulation protocol: Yes   Plan:  Give 3500 units bolus x 1 Start heparin infusion at 600 units/hr Check anti-Xa level in 6 hours and daily while on heparin Continue to monitor H&H and platelets  Alicia Patterson 12/14/2016,9:23 PM

## 2016-12-14 NOTE — ED Notes (Signed)
Pt to the ER for pain in the center of chest that moves through to her back with a deep breath. Pt has had some coughing. Pt has 1.96

## 2016-12-14 NOTE — ED Notes (Signed)
Date and time results received: 12/14/16 1925 (use smartphrase ".now" to insert current time)  Test: troponin Critical Value: 1.96  Name of Provider Notified: malinda  Orders Received? Or Actions Taken?: waiting for instructions

## 2016-12-14 NOTE — ED Notes (Signed)
Family at bedside. 

## 2016-12-14 NOTE — ED Provider Notes (Addendum)
[facility] Emergency Department Provider Note   ____________________________________________   First MD Initiated Contact with Patient 12/14/16 1901     (approximate)  I have reviewed the triage vital signs and the nursing notes.   HISTORY  Chief Complaint Chest Pain; Tingling; and Stress (husband died yesterday)    HPI Alicia Patterson is a 83 y.o. female Whose husband died yesterday. She has not been away from him for 50 years. Comes in today complaining of chest heaviness and tightness or get sharp and radiates to her back when she takes a deep breath. She also has tingling in her hands. EKG shows rightward axis and some nonspecific ST-T wave changes.   Past Medical History:  Diagnosis Date  . Cancer ([place])   . Coronary artery disease   . GERD (gastroesophageal reflux disease)   . Glaucoma     Patient Active Problem List   Diagnosis Date Noted  . Small bowel obstruction ([place]) 12/29/2014    Past Surgical History:  Procedure Laterality Date  . ABDOMINAL HYSTERECTOMY    . BREAST BIOPSY Left 05/22/2016   neg-ORGANIZING FAT NECROSIS. VASCULAR CALCS   . CHOLECYSTECTOMY    . TONSILLECTOMY      Prior to Admission medications   Medication Sig Start Date End Date Taking? Authorizing Provider  aspirin EC 81 MG tablet Take 1 tablet by mouth daily.    [provider]  brimonidine-timolol (COMBIGAN) 0.2-0.5 % ophthalmic solution Place 1 drop into both eyes daily. 02/21/14   [provider]  ciprofloxacin (CIPRO) 500 MG tablet Take 1 tablet (500 mg total) by mouth 2 (two) times daily. 01/01/15   Leonie Green, MD  ciprofloxacin (CIPRO) 500 MG tablet Take 1 tablet (500 mg total) by mouth 2 (two) times daily. 01/01/15   Leonie Green, MD  dorzolamide (TRUSOPT) 2 % ophthalmic solution Place 1 drop into both eyes daily. 05/06/11   [provider]  latanoprost (XALATAN) 0.005 % ophthalmic solution Place 1 drop into both  eyes daily.    [provider]  omeprazole (PRILOSEC) 20 MG capsule Take 1 capsule by mouth daily. 08/18/14   [provider]  verapamil (CALAN-SR) 180 MG CR tablet Take 1 tablet by mouth daily. 10/22/14   [provider]    Allergies Sulfa antibiotics  Family History  Problem Relation Age of Onset  . Breast cancer Sister 56  . Breast cancer Other 49    Social History Social History  Substance Use Topics  . Smoking status: Former Research scientist (life sciences)  . Smokeless tobacco: Never Used  . Alcohol use No     Constitutional: No fever/chills Eyes: No visual changes. ENT: No sore throat. Cardiovascular:  chest pain. Respiratory:  shortness of breath. Gastrointestinal: No abdominal pain.  No nausea, no vomiting.  No diarrhea.  No constipation. Genitourinary: Negative for dysuria. Musculoskeletal: Negative for back pain. Skin: Negative for rash. Neurological: Negative for headaches, focal weakness  ____________________________________________   PHYSICAL EXAM:  VITAL SIGNS: ED Triage Vitals  Enc Vitals Group     BP 12/14/16 1838 94/62     Pulse Rate 12/14/16 1838 89     Resp 12/14/16 1838 16     Temp 12/14/16 1838 98.2 F (36.8 C)     Temp src --      SpO2 12/14/16 1838 99 %     Weight 12/14/16 1839 129 lb (58.5 kg)     Height 12/14/16 1839 5' 3.5" (1.613 m)  Head Circumference --      Peak Flow --      Pain Score 12/14/16 1838 5     Pain Loc --      Pain Edu? --      Excl. in [place]? --     Constitutional: Alert and oriented. Well appearing and in no acute distress. Eyes: Conjunctivae are normal.  Head: Atraumatic. Nose: No congestion/rhinnorhea. Mouth/Throat: Mucous membranes are moist.  Oropharynx non-erythematous. Neck: No stridor.   Cardiovascular: Normal rate, regular rhythm. Grossly normal heart sounds.  Good peripheral circulation. Respiratory: Normal respiratory effort.  No retractions. Lungs CTAB. Gastrointestinal: Soft and nontender. No  distention. No abdominal bruits. No CVA tenderness. Musculoskeletal: No lower extremity tenderness nor edema.  No joint effusions. Neurologic:  Normal speech and language. No gross focal neurologic deficits are appreciated. No gait instability. Skin:  Skin is warm, dry and intact. No rash noted. Psychiatric: Mood and affect are normal. Speech and behavior are normal.  ____________________________________________   LABS (all labs ordered are listed, but only abnormal results are displayed)  Labs Reviewed  BASIC METABOLIC PANEL - Abnormal; Notable for the following:       Result Value   Sodium 133 (*)    Glucose, Bld 104 (*)    Creatinine, Ser 1.02 (*)    Calcium 8.4 (*)    GFR calc non Af Amer 49 (*)    GFR calc Af Amer 57 (*)    All other components within normal limits  CBC - Abnormal; Notable for the following:    WBC 13.6 (*)    All other components within normal limits  TROPONIN I - Abnormal; Notable for the following:    Troponin I 1.96 (*)    All other components within normal limits  BRAIN NATRIURETIC PEPTIDE  PROTIME-INR  APTT   ____________________________________________  EKG  EKG read and interpreted by me shows normal sinus rhythm at a rate of 90 rightward axis there is an S1 every 3 but no T3 she has some ____________________________________________  RADIOLOGY ST segment downsloping in the lateral chest leads basically nonspecific ST-T wave changes Dg Chest 2 View  Result Date: 12/14/2016 CLINICAL DATA:  Chest pain and shortness of Breath EXAM: CHEST  2 VIEW COMPARISON:  05/20/2011 FINDINGS: Cardiac shadow is within normal limits. Aortic calcifications are again seen. Mild bibasilar atelectatic changes are noted. No focal confluent infiltrate or sizable effusion is noted. No acute bony abnormality is seen. IMPRESSION: Mild bibasilar atelectasis. Electronically Signed   By: Inez Catalina M.D.   On: 12/14/2016 19:28   Ct Angio Chest Pe W And/or Wo  Contrast  Result Date: 12/14/2016 CLINICAL DATA:  Central chest pain, pleuritic. Radiation to the back. EXAM: CT ANGIOGRAPHY CHEST WITH CONTRAST TECHNIQUE: Multidetector CT imaging of the chest was performed using the standard protocol during bolus administration of intravenous contrast. Multiplanar CT image reconstructions and MIPs were obtained to evaluate the vascular anatomy. CONTRAST:  75 mL Isovue 370 intravenous COMPARISON:  05/20/2011, 12/14/2016. FINDINGS: Cardiovascular: Satisfactory opacification of the pulmonary arteries to the segmental level. No evidence of pulmonary embolism. Borderline heart size. No pericardial effusion. Extensive atherosclerotic calcification of the aorta and coronary arteries Mediastinum/Nodes: No enlarged mediastinal, hilar, or axillary lymph nodes. Thyroid gland, trachea, and esophagus demonstrate no significant findings. Lungs/Pleura: No consolidation. No pleural effusion. There is interlobular septal thickening which is new. This most likely represents interstitial fluid. There is slight ground-glass opacity in the dependent lung bases, possibly a degree of alveolar  edema. Upper Abdomen: No acute findings. Musculoskeletal: No significant skeletal lesion. Review of the MIP images confirms the above findings. IMPRESSION: 1. Negative for acute pulmonary embolism. 2. Interlobular septal thickening and slight ground-glass opacity in the dependent lung bases. This could represent interstitial and alveolar edema. 3. Extensive aortic and coronary atherosclerosis. Electronically Signed   By: Andreas Newport M.D.   On: 12/14/2016 20:32   chest x-ray read as [place] by basilar atelectasis by radiology I personally am wondering if it could be some CHF going on.  CT scan is read as possible CHF. ____________________________________________   PROCEDURES  Procedure(s) performed:   Procedures  Critical Care performed:    ____________________________________________   INITIAL IMPRESSION / ASSESSMENT AND PLAN / ED COURSE  ----------------------------------------- 9:20 PM on 12/14/2016 -----------------------------------------  Patient is now feeling better she has little bit of pain in her shoulder but otherwise feels better she says he is not short of breath chest pain is basically gone I will start some heparin since she has apparently in an STEMI and may have some congestive failure as well per CT scan BNP hasn't come back yet.  hospitalists is aware and will see her as soon as he possibly can.      ____________________________________________   FINAL CLINICAL IMPRESSION(S) / ED DIAGNOSES  Final diagnoses:  NSTEMI (non-ST elevated myocardial infarction) ([place])      NEW MEDICATIONS STARTED DURING THIS VISIT:  New Prescriptions   No medications on file     Note:  This document was prepared using Dragon voice recognition software and may include unintentional dictation errors.    Nena Polio, MD 12/14/16 2120    Nena Polio, MD 12/14/16 2121    Nena Polio, MD 12/14/16 2236

## 2016-12-14 NOTE — ED Notes (Signed)
Pt c/o increased pain, unable to get a deep breath.

## 2016-12-14 NOTE — ED Notes (Signed)
Patient transported to CT 

## 2016-12-15 ENCOUNTER — Inpatient Hospital Stay
Admit: 2016-12-15 | Discharge: 2016-12-15 | Disposition: A | Discharge status: Home / Self Care | Payer: Medicare Other | Attending: Cardiovascular Disease | Admitting: Cardiovascular Disease

## 2016-12-15 ENCOUNTER — Encounter
Admission: EM | Disposition: A | Discharge status: Other Acute Inpatient Hospital | Payer: Self-pay | Source: Home / Self Care | Attending: Internal Medicine

## 2016-12-15 ENCOUNTER — Encounter: Payer: Self-pay | Admitting: *Deleted

## 2016-12-15 ENCOUNTER — Ambulatory Visit (HOSPITAL_COMMUNITY)
Admission: AD | Admit: 2016-12-15 | Discharge: 2016-12-15 | Disposition: A | Discharge status: Home / Self Care | Payer: Medicare Other | Source: Other Acute Inpatient Hospital | Attending: Internal Medicine | Admitting: Internal Medicine

## 2016-12-15 DIAGNOSIS — F419 Anxiety disorder, unspecified: Secondary | ICD-10-CM | POA: Diagnosis present

## 2016-12-15 DIAGNOSIS — Z90710 Acquired absence of both cervix and uterus: Secondary | ICD-10-CM | POA: Diagnosis not present

## 2016-12-15 DIAGNOSIS — Z87891 Personal history of nicotine dependence: Secondary | ICD-10-CM | POA: Diagnosis not present

## 2016-12-15 DIAGNOSIS — Z634 Disappearance and death of family member: Secondary | ICD-10-CM | POA: Diagnosis not present

## 2016-12-15 DIAGNOSIS — G4700 Insomnia, unspecified: Secondary | ICD-10-CM | POA: Diagnosis present

## 2016-12-15 DIAGNOSIS — I471 Supraventricular tachycardia: Secondary | ICD-10-CM | POA: Diagnosis present

## 2016-12-15 DIAGNOSIS — Z880 Allergy status to penicillin: Secondary | ICD-10-CM | POA: Diagnosis not present

## 2016-12-15 DIAGNOSIS — H409 Unspecified glaucoma: Secondary | ICD-10-CM | POA: Diagnosis present

## 2016-12-15 DIAGNOSIS — I214 Non-ST elevation (NSTEMI) myocardial infarction: Secondary | ICD-10-CM | POA: Insufficient documentation

## 2016-12-15 DIAGNOSIS — J810 Acute pulmonary edema: Secondary | ICD-10-CM | POA: Diagnosis present

## 2016-12-15 DIAGNOSIS — Z803 Family history of malignant neoplasm of breast: Secondary | ICD-10-CM | POA: Diagnosis not present

## 2016-12-15 DIAGNOSIS — Z79899 Other long term (current) drug therapy: Secondary | ICD-10-CM | POA: Diagnosis not present

## 2016-12-15 DIAGNOSIS — R079 Chest pain, unspecified: Secondary | ICD-10-CM | POA: Diagnosis not present

## 2016-12-15 DIAGNOSIS — Z9049 Acquired absence of other specified parts of digestive tract: Secondary | ICD-10-CM | POA: Diagnosis not present

## 2016-12-15 DIAGNOSIS — Z882 Allergy status to sulfonamides status: Secondary | ICD-10-CM | POA: Diagnosis not present

## 2016-12-15 DIAGNOSIS — Z888 Allergy status to other drugs, medicaments and biological substances status: Secondary | ICD-10-CM | POA: Diagnosis not present

## 2016-12-15 DIAGNOSIS — K219 Gastro-esophageal reflux disease without esophagitis: Secondary | ICD-10-CM | POA: Diagnosis present

## 2016-12-15 DIAGNOSIS — Z7982 Long term (current) use of aspirin: Secondary | ICD-10-CM | POA: Diagnosis not present

## 2016-12-15 DIAGNOSIS — I2510 Atherosclerotic heart disease of native coronary artery without angina pectoris: Secondary | ICD-10-CM | POA: Diagnosis present

## 2016-12-15 DIAGNOSIS — Z809 Family history of malignant neoplasm, unspecified: Secondary | ICD-10-CM | POA: Diagnosis not present

## 2016-12-15 HISTORY — PX: LEFT HEART CATH AND CORONARY ANGIOGRAPHY: CATH118249

## 2016-12-15 LAB — BASIC METABOLIC PANEL
Anion gap: 8 (ref 5–15)
BUN: 14 mg/dL (ref 6–20)
CHLORIDE: 101 mmol/L (ref 101–111)
CO2: 24 mmol/L (ref 22–32)
CREATININE: 0.84 mg/dL (ref 0.44–1.00)
Calcium: 8 mg/dL — ABNORMAL LOW (ref 8.9–10.3)
GFR calc Af Amer: >60 mL/min (ref >60)
GFR calc non Af Amer: >60 mL/min (ref >60)
GLUCOSE: 153 mg/dL — AB (ref 65–99)
Potassium: 3.5 mmol/L (ref 3.5–5.1)
Sodium: 133 mmol/L — ABNORMAL LOW (ref 135–145)

## 2016-12-15 LAB — CBC
HCT: 34 % — ABNORMAL LOW (ref 35.0–47.0)
HEMOGLOBIN: 11.4 g/dL — AB (ref 12.0–16.0)
MCH: 30.5 pg (ref 26.0–34.0)
MCHC: 33.5 g/dL (ref 32.0–36.0)
MCV: 90.9 fL (ref 80.0–100.0)
Platelets: 172 10*3/uL (ref 150–440)
RBC: 3.74 MIL/uL — ABNORMAL LOW (ref 3.80–5.20)
RDW: 13.9 % (ref 11.5–14.5)
WBC: 11.9 10*3/uL — AB (ref 3.6–11.0)

## 2016-12-15 LAB — LIPID PANEL
CHOL/HDL RATIO: 3.1 ratio
CHOLESTEROL: 144 mg/dL (ref 0–200)
HDL: 47 mg/dL (ref >40)
LDL Cholesterol: 92 mg/dL (ref 0–99)
TRIGLYCERIDES: 27 mg/dL (ref <150)
VLDL: 5 mg/dL (ref 0–40)

## 2016-12-15 LAB — TROPONIN I
TROPONIN I: 2.78 ng/mL — AB (ref <0.03)
TROPONIN I: 4.46 ng/mL — AB (ref <0.03)
Troponin I: 1.78 ng/mL (ref <0.03)

## 2016-12-15 LAB — HEPARIN LEVEL (UNFRACTIONATED)
HEPARIN UNFRACTIONATED: 0.38 [IU]/mL (ref 0.30–0.70)
Heparin Unfractionated: 0.35 IU/mL (ref 0.30–0.70)

## 2016-12-15 LAB — GLUCOSE, CAPILLARY: GLUCOSE-CAPILLARY: 118 mg/dL — AB (ref 65–99)

## 2016-12-15 LAB — MRSA PCR SCREENING: MRSA by PCR: NEGATIVE

## 2016-12-15 LAB — MAGNESIUM: Magnesium: 1.7 mg/dL (ref 1.7–2.4)

## 2016-12-15 LAB — TSH: TSH: 0.731 u[IU]/mL (ref 0.350–4.500)

## 2016-12-15 SURGERY — LEFT HEART CATH AND CORONARY ANGIOGRAPHY
Anesthesia: Moderate Sedation | Laterality: Right

## 2016-12-15 SURGERY — LEFT HEART CATH AND CORONARY ANGIOGRAPHY
Anesthesia: Moderate Sedation

## 2016-12-15 MED ORDER — SODIUM CHLORIDE 0.9% FLUSH
3.0000 mL | Freq: Two times a day (BID) | INTRAVENOUS | Status: DC
  Administered 2016-12-15 (×3): 3 mL via INTRAVENOUS

## 2016-12-15 MED ORDER — ATORVASTATIN CALCIUM 40 MG PO TABS: 40.0000 mg | ORAL_TABLET | Freq: Every day | ORAL | 0 refills | Status: AC

## 2016-12-15 MED ORDER — DORZOLAMIDE HCL 2 % OP SOLN
1.0000 [drp] | Freq: Every day | OPHTHALMIC | Status: DC
  Administered 2016-12-15: 1 [drp] via OPHTHALMIC
  Filled 2016-12-15: qty 10

## 2016-12-15 MED ORDER — HEPARIN (PORCINE) IN NACL 100-0.45 UNIT/ML-% IJ SOLN: 12.0000 [IU]/kg/h | INTRAMUSCULAR | Status: DC

## 2016-12-15 MED ORDER — HEPARIN (PORCINE) IN NACL 100-0.45 UNIT/ML-% IJ SOLN
700.0000 [IU]/h | INTRAMUSCULAR | Status: DC
  Administered 2016-12-15: 700 [IU]/h via INTRAVENOUS

## 2016-12-15 MED ORDER — BRIMONIDINE TARTRATE-TIMOLOL 0.2-0.5 % OP SOLN
1.0000 [drp] | Freq: Two times a day (BID) | OPHTHALMIC | Status: DC
  Filled 2016-12-15: qty 5

## 2016-12-15 MED ORDER — LATANOPROST 0.005 % OP SOLN
1.0000 [drp] | Freq: Every day | OPHTHALMIC | Status: DC
  Filled 2016-12-15: qty 2.5

## 2016-12-15 MED ORDER — ACETAMINOPHEN 325 MG PO TABS: 650.0000 mg | ORAL_TABLET | Freq: Four times a day (QID) | ORAL | Status: DC | PRN

## 2016-12-15 MED ORDER — NITROGLYCERIN IN D5W 200-5 MCG/ML-% IV SOLN: 0.0000 ug/min | INTRAVENOUS | Status: DC

## 2016-12-15 MED ORDER — ONDANSETRON HCL 4 MG/2ML IJ SOLN
INTRAMUSCULAR | Status: AC
  Filled 2016-12-15: qty 2

## 2016-12-15 MED ORDER — ONDANSETRON HCL 4 MG/2ML IJ SOLN
4.0000 mg | Freq: Once | INTRAMUSCULAR | Status: AC
  Administered 2016-12-15: 4 mg via INTRAVENOUS

## 2016-12-15 MED ORDER — MORPHINE SULFATE (PF) 2 MG/ML IV SOLN
2.0000 mg | INTRAVENOUS | Status: DC | PRN
  Administered 2016-12-15 (×3): 2 mg via INTRAVENOUS
  Filled 2016-12-15 (×4): qty 1

## 2016-12-15 MED ORDER — MIDAZOLAM HCL 2 MG/2ML IJ SOLN
INTRAMUSCULAR | Status: DC | PRN
  Administered 2016-12-15: 0.5 mg via INTRAVENOUS

## 2016-12-15 MED ORDER — NITROGLYCERIN 0.4 MG SL SUBL
0.4000 mg | SUBLINGUAL_TABLET | SUBLINGUAL | Status: DC | PRN
  Administered 2016-12-15 (×2): 0.4 mg via SUBLINGUAL
  Filled 2016-12-15 (×2): qty 1

## 2016-12-15 MED ORDER — SODIUM CHLORIDE 0.9% FLUSH: 3.0000 mL | INTRAVENOUS | Status: DC | PRN

## 2016-12-15 MED ORDER — LATANOPROST 0.005 % OP SOLN
1.0000 [drp] | Freq: Every day | OPHTHALMIC | Status: DC
  Administered 2016-12-15: 1 [drp] via OPHTHALMIC
  Filled 2016-12-15: qty 2.5

## 2016-12-15 MED ORDER — HEPARIN BOLUS VIA INFUSION
3500.0000 [IU] | Freq: Once | INTRAVENOUS | Status: DC
  Filled 2016-12-15: qty 3500

## 2016-12-15 MED ORDER — POTASSIUM CHLORIDE 20 MEQ PO PACK
40.0000 meq | PACK | Freq: Once | ORAL | Status: AC
  Administered 2016-12-15: 40 meq via ORAL
  Filled 2016-12-15: qty 2

## 2016-12-15 MED ORDER — FENTANYL CITRATE (PF) 100 MCG/2ML IJ SOLN
INTRAMUSCULAR | Status: DC | PRN
  Administered 2016-12-15: 25 ug via INTRAVENOUS

## 2016-12-15 MED ORDER — ATORVASTATIN CALCIUM 20 MG PO TABS: 40.0000 mg | ORAL_TABLET | Freq: Every day | ORAL | Status: DC

## 2016-12-15 MED ORDER — PANTOPRAZOLE SODIUM 40 MG IV SOLR: 40.0000 mg | Freq: Every day | INTRAVENOUS | Status: DC

## 2016-12-15 MED ORDER — SODIUM CHLORIDE 0.9 % IV SOLN: 250.0000 mL | INTRAVENOUS | Status: DC | PRN

## 2016-12-15 MED ORDER — MIDAZOLAM HCL 2 MG/2ML IJ SOLN
INTRAMUSCULAR | Status: AC
  Filled 2016-12-15: qty 2

## 2016-12-15 MED ORDER — ONDANSETRON HCL 4 MG/2ML IJ SOLN: 4.0000 mg | Freq: Once | INTRAMUSCULAR | Status: DC

## 2016-12-15 MED ORDER — ONDANSETRON HCL 4 MG/2ML IJ SOLN: 4.0000 mg | Freq: Four times a day (QID) | INTRAMUSCULAR | Status: DC | PRN

## 2016-12-15 MED ORDER — BRIMONIDINE TARTRATE 0.2 % OP SOLN
1.0000 [drp] | Freq: Two times a day (BID) | OPHTHALMIC | Status: DC
  Administered 2016-12-15 (×2): 1 [drp] via OPHTHALMIC
  Filled 2016-12-15: qty 5

## 2016-12-15 MED ORDER — SODIUM CHLORIDE 0.9% FLUSH
3.0000 mL | Freq: Two times a day (BID) | INTRAVENOUS | Status: DC
  Administered 2016-12-15: 3 mL via INTRAVENOUS

## 2016-12-15 MED ORDER — ASPIRIN EC 325 MG PO TBEC
325.0000 mg | DELAYED_RELEASE_TABLET | Freq: Every day | ORAL | Status: DC
  Administered 2016-12-15: 325 mg via ORAL
  Filled 2016-12-15: qty 1

## 2016-12-15 MED ORDER — CYANOCOBALAMIN 500 MCG PO TABS
500.0000 ug | ORAL_TABLET | Freq: Every day | ORAL | Status: DC
  Administered 2016-12-15: 500 ug via ORAL

## 2016-12-15 MED ORDER — IOPAMIDOL (ISOVUE-300) INJECTION 61%
INTRAVENOUS | Status: DC | PRN
  Administered 2016-12-15: 130 mL via INTRAVENOUS

## 2016-12-15 MED ORDER — METOPROLOL TARTRATE 25 MG PO TABS: 25.0000 mg | ORAL_TABLET | Freq: Once | ORAL | Status: DC

## 2016-12-15 MED ORDER — SODIUM CHLORIDE 0.9% FLUSH: 3.0000 mL | Freq: Two times a day (BID) | INTRAVENOUS | Status: DC

## 2016-12-15 MED ORDER — ONDANSETRON HCL 4 MG PO TABS: 4.0000 mg | ORAL_TABLET | Freq: Four times a day (QID) | ORAL | Status: DC | PRN

## 2016-12-15 MED ORDER — CLOBETASOL PROPIONATE 0.05 % EX OINT
1.0000 "application " | TOPICAL_OINTMENT | CUTANEOUS | Status: DC
  Filled 2016-12-15: qty 15

## 2016-12-15 MED ORDER — POLYETHYLENE GLYCOL 3350 17 G PO PACK
17.0000 g | PACK | Freq: Every day | ORAL | Status: DC | PRN
Start: 2016-12-15 — End: 2016-12-16

## 2016-12-15 MED ORDER — MORPHINE SULFATE (PF) 2 MG/ML IV SOLN
INTRAVENOUS | Status: AC
  Filled 2016-12-15: qty 1

## 2016-12-15 MED ORDER — MAGNESIUM SULFATE IN D5W 1-5 GM/100ML-% IV SOLN
1.0000 g | Freq: Once | INTRAVENOUS | Status: AC
Start: 2016-12-15 — End: 2016-12-15
  Administered 2016-12-15: 1 g via INTRAVENOUS
  Filled 2016-12-15: qty 100

## 2016-12-15 MED ORDER — HEPARIN SODIUM (PORCINE) 5000 UNIT/ML IJ SOLN: 5000.0000 [IU] | Freq: Three times a day (TID) | INTRAMUSCULAR | Status: DC

## 2016-12-15 MED ORDER — TRIAMCINOLONE ACETONIDE 0.025 % EX CREA
TOPICAL_CREAM | CUTANEOUS | Status: DC
  Filled 2016-12-15: qty 15

## 2016-12-15 MED ORDER — SODIUM CHLORIDE 0.9 % WEIGHT BASED INFUSION: 1.0000 mL/kg/h | INTRAVENOUS | Status: DC

## 2016-12-15 MED ORDER — MORPHINE SULFATE (PF) 2 MG/ML IV SOLN
1.0000 mg | Freq: Once | INTRAVENOUS | Status: AC
  Administered 2016-12-15: 1 mg via INTRAVENOUS

## 2016-12-15 MED ORDER — TIMOLOL MALEATE 0.5 % OP SOLN
1.0000 [drp] | Freq: Two times a day (BID) | OPHTHALMIC | Status: DC
  Administered 2016-12-15 (×2): 1 [drp] via OPHTHALMIC
  Filled 2016-12-15: qty 5

## 2016-12-15 MED ORDER — HEPARIN (PORCINE) IN NACL 2-0.9 UNIT/ML-% IJ SOLN
INTRAMUSCULAR | Status: AC
  Filled 2016-12-15: qty 500

## 2016-12-15 MED ORDER — ACETAMINOPHEN 325 MG PO TABS: 650.0000 mg | ORAL_TABLET | ORAL | Status: DC | PRN

## 2016-12-15 MED ORDER — ASPIRIN 81 MG PO CHEW
324.0000 mg | CHEWABLE_TABLET | Freq: Once | ORAL | Status: AC
  Administered 2016-12-14: 327 mg via ORAL

## 2016-12-15 MED ORDER — ACETAMINOPHEN 650 MG RE SUPP: 650.0000 mg | Freq: Four times a day (QID) | RECTAL | Status: DC | PRN

## 2016-12-15 MED ORDER — FENTANYL CITRATE (PF) 100 MCG/2ML IJ SOLN
INTRAMUSCULAR | Status: AC
  Filled 2016-12-15: qty 2

## 2016-12-15 MED ORDER — NITROGLYCERIN 2 % TD OINT: 0.5000 [in_us] | TOPICAL_OINTMENT | Freq: Four times a day (QID) | TRANSDERMAL | Status: DC | PRN

## 2016-12-15 MED ORDER — SODIUM CHLORIDE 0.9 % IV BOLUS (SEPSIS)
500.0000 mL | Freq: Once | INTRAVENOUS | Status: AC
  Administered 2016-12-15: 500 mL via INTRAVENOUS

## 2016-12-15 MED ORDER — CLONAZEPAM 0.125 MG PO TBDP: 0.2500 mg | ORAL_TABLET | Freq: Three times a day (TID) | ORAL | Status: DC | PRN

## 2016-12-15 MED ORDER — PANTOPRAZOLE SODIUM 40 MG PO TBEC
40.0000 mg | DELAYED_RELEASE_TABLET | Freq: Every day | ORAL | Status: DC
  Administered 2016-12-15: 40 mg via ORAL
  Filled 2016-12-15: qty 1

## 2016-12-15 MED ORDER — NITROGLYCERIN 0.3 MG SL SUBL: 0.3000 mg | SUBLINGUAL_TABLET | SUBLINGUAL | 3 refills | Status: DC | PRN

## 2016-12-15 MED ORDER — ASPIRIN 325 MG PO TBEC: 325.0000 mg | DELAYED_RELEASE_TABLET | Freq: Every day | ORAL | 0 refills | Status: DC

## 2016-12-15 SURGICAL SUPPLY — 10 items
CATH 5FR JL4 DIAGNOSTIC (CATHETERS) ×6 IMPLANT
CATH INFINITI 5FR ANG PIGTAIL (CATHETERS) ×3 IMPLANT
CATH INFINITI JR4 5F (CATHETERS) ×3 IMPLANT
DEVICE CLOSURE MYNXGRIP 5F (Vascular Products) ×3 IMPLANT
KIT MANI 3VAL PERCEP (MISCELLANEOUS) ×3 IMPLANT
NEEDLE PERC 18GX7CM (NEEDLE) ×3 IMPLANT
PACK CARDIAC CATH (CUSTOM PROCEDURE TRAY) ×3 IMPLANT
SHEATH AVANTI 5FR X 11CM (SHEATH) ×3 IMPLANT
SHEATH AVANTI 6FR X 11CM (SHEATH) IMPLANT
WIRE EMERALD 3MM-J .035X150CM (WIRE) ×3 IMPLANT

## 2016-12-15 NOTE — Progress Notes (Signed)
Pt remains alert and oriented x4 today. Pt's pain has been controlled by nitro sublingual and rest. Pt's right femoral access site remains a level 0, no swelling, no bruising or tenderness. Pt's family at bedside. Awaiting bed placement at [facility]. Full assessment can be found in EPIC.

## 2016-12-15 NOTE — Progress Notes (Signed)
Patient had cardiac catheterization without complication. Cardiac catheterization revealed ostial 90% lesion in the LAD as well as 70-80% mid LAD lesion with haziness. Left circumflex has no significant disease but right coronary in the midportion is occluded with bridging collaterals. LV gram shows left ventricle ejection fraction approximately 35-40% with anteroapical akinesis suggestive of aneurysm. 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 Troponin was only 1.78. And chest pain onset was yesterday. The ostial LAD lesion is bifurcating lesion and I reviewed the films with Dr. Sharlene Dory, and have sent the films to [facility] for evaluation for possible high risk PCI orCABG.

## 2016-12-15 NOTE — Progress Notes (Signed)
This nurse entered patients room. Pt states 9/10 chest pain sublingual nitro given per PRN orders. Pt states Chest pain now 7/10. Will give additional sub lingual nitro per PRN orders. Will continue to assess.

## 2016-12-15 NOTE — Discharge Summary (Signed)
Alicia Patterson, is a 83 y.o. female  DOB 02-16-1931  MRN [id number].  Admission date:  12/14/2016  Admitting Physician  Gorden Harms, MD  Discharge Date:  12/15/2016   Primary MD  Derinda Late, MD  Recommendations for primary care physician for things Transfer to [facility] for High Risk CABG.  Admission Diagnosis  NSTEMI (non-ST elevated myocardial infarction) [facility]) [I21.4]   Discharge Diagnosis  NSTEMI (non-ST elevated myocardial infarction) ([place]) [I21.4]  Active Problems:   NSTEMI (non-ST elevated myocardial infarction) [facility])      Past Medical History:  Diagnosis Date  . Cancer ([place])   . Coronary artery disease   . GERD (gastroesophageal reflux disease)   . Glaucoma     Past Surgical History:  Procedure Laterality Date  . ABDOMINAL HYSTERECTOMY    . BREAST BIOPSY Left 05/22/2016   neg-ORGANIZING FAT NECROSIS. VASCULAR CALCS   . CHOLECYSTECTOMY    . TONSILLECTOMY         History of present illness and  Hospital Course:     Kindly see H&P for history of present illness and admission details, please review complete Labs, Consult reports and Test reports for all details in brief  HPI  from the history and physical done on the day of admission 83  Yr old female past medical history which includes coronary artery disease-last heart catheterization was greater than 10 years ago noted for minimal obstructive disease for which medical management was recommended at that time, paroxysmal supraventricular tachycardia, esophageal dysfunction, hyperlipidemia, anemia, presenting with recent loss of her husband on yesterday, noted acute mid sharp persistent chest pain radiating into the left shoulder, associated with shortness of breath/diaphoresis, noted heart palpitations, hand tingling,  decreased by mouth intake, sleep deprivation given loss of level I, associated cough, emergency room patient was found to have BNP 588, troponin initially normal-second troponin 1.9, white count 13,000, EKG with benign changes/no evidence of acute coronary syndrome, chest x-ray noted for atelectasis, CT of the chest negative for PE/possible edema, possible cervicitis only contact further evaluation/care. Patient evaluated bedside in emergency room, Dora the bedside, patient was started on heparin drip in discussion with ER attending, noted low normal blood pressures with IV morphine, patient is now being admitted for acute non-STEMI with probable acute flash pulmonary edema, cannot exclude possible Takasubo's syndrome.    Hospital Course   1 acute non-STEMI  With associated pulmonary edema, cannot exclude possible Takasubo's syndrome admit to stepdown unit on our acute coronary syndrome protocol,Intensivist called for expert opinion, continue aspirin, heparin drip, nitroglycerin drip when necessary, IV morphine when necessary, supplemental oxygen via nasal cannula, continuous cycle cardiac enzymes, cardiology consult for expert opinion, check echocardiogram, beta blocker therapy, statin therapy-check lipids in the morning, Lasix 40 mg IV 1 now, and continue close medical monitoring  Cardiology saw the pt and as follows  Patient had cardiac catheterization without complication. Cardiac catheterization revealed ostial 90% lesion in the LAD as well as 70-80% mid LAD lesion with haziness. Left circumflex has no significant disease but right coronary in the midportion is occluded with bridging collaterals. LV gram shows left ventricle ejection fraction approximately 35-40% with anteroapical akinesis suggestive of aneurysm. 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 Troponin was only 1.78. And chest pain onset was yesterday. The ostial LAD lesion is bifurcating lesion and I reviewed  the films with Dr. Sharlene Dory, and have sent the films to [facility] for evaluation for possible high risk PCI  orCABG.  [facility] accepted the pt, transfer.  2 history of supraventricular tachycardia Stable Continue verapamil  3 chronic GERD without esophagitis PPI daily  4 chronic glaucoma Stable Continue Combigan, Trusopt, Xalatan  5 acute bereavement Lost her husband one day ago Klonopin when necessary anxiety Restoril at bedtime when necessary insomnia   Discharge Condition: stable.   Follow UP      Discharge Instructions  and  Discharge Medications   Follow with cardiology in 1 week.   Allergies as of 12/15/2016      Reactions   Amoxicillin-pot Clavulanate    Other reaction(s): Unknown Pt states she is not allergic to this medication   Cefdinir    Other reaction(s): Other (See Comments)   Gatifloxacin    Other reaction(s): Unknown Other reaction(s): Other (See Comments)   Sulfa Antibiotics Rash   Sulfasalazine Rash      Medication List    STOP taking these medications   ciprofloxacin 250 MG tablet Commonly known as:  CIPRO   ESTRACE VAGINAL 0.1 MG/GM vaginal cream Generic drug:  estradiol   nitrofurantoin (macrocrystal-monohydrate) 100 MG capsule Commonly known as:  MACROBID   verapamil 180 MG CR tablet Commonly known as:  CALAN-SR     TAKE these medications   aspirin 325 MG EC tablet Take 1 tablet (325 mg total) by mouth daily. What changed:  medication strength  how much to take   atorvastatin 40 MG tablet Commonly known as:  LIPITOR Take 1 tablet (40 mg total) by mouth daily at 6 PM.   brimonidine-timolol 0.2-0.5 % ophthalmic solution Commonly known as:  COMBIGAN Place 1 drop into both eyes every 12 (twelve) hours.   clobetasol ointment 0.05 % Commonly known as:  TEMOVATE Apply 1 application topically every other day.   cyanocobalamin 500 MCG tablet Take 500 mcg by mouth daily.   dorzolamide 2 % ophthalmic solution Commonly  known as:  TRUSOPT Place 1 drop into both eyes daily.   latanoprost 0.005 % ophthalmic solution Commonly known as:  XALATAN Place 1 drop into both eyes daily.   nitroGLYCERIN 0.3 MG SL tablet Commonly known as:  NITROSTAT Place 1 tablet (0.3 mg total) under the tongue every 5 (five) minutes as needed for chest pain.   omeprazole 20 MG capsule Commonly known as:  PRILOSEC Take 1 capsule by mouth daily.         Diet and Activity recommendation: See Discharge Instructions above   Consults obtained - Cardiology   Major procedures and Radiology Reports - PLEASE review detailed and final reports for all details, in brief -   Patient had cardiac catheterization without complication. Cardiac catheterization revealed ostial 90% lesion in the LAD as well as 70-80% mid LAD lesion with haziness. Left circumflex has no significant disease but right coronary in the midportion is occluded with bridging collaterals. LV gram shows left ventricle ejection fraction approximately 35-40% with anteroapical akinesis suggestive of aneurysm. 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 Troponin was only 1.78. And chest pain onset was yesterday. The ostial LAD lesion is bifurcating lesion and I reviewed the films with Dr. Sharlene Dory, and have sent the films to [facility] for evaluation for possible high risk PCI orCABG.  Dg Chest 2 View  Result Date: 12/14/2016 CLINICAL DATA:  Chest pain and shortness of Breath EXAM: CHEST  2 VIEW COMPARISON:  05/20/2011 FINDINGS: Cardiac shadow is within normal limits. Aortic calcifications are again seen. Mild bibasilar atelectatic changes are noted. No focal confluent infiltrate  or sizable effusion is noted. No acute bony abnormality is seen. IMPRESSION: Mild bibasilar atelectasis. Electronically Signed   By: Inez Catalina M.D.   On: 12/14/2016 19:28   Ct Angio Chest Pe W And/or Wo Contrast  Result Date: 12/14/2016 CLINICAL DATA:  Central chest pain,  pleuritic. Radiation to the back. EXAM: CT ANGIOGRAPHY CHEST WITH CONTRAST TECHNIQUE: Multidetector CT imaging of the chest was performed using the standard protocol during bolus administration of intravenous contrast. Multiplanar CT image reconstructions and MIPs were obtained to evaluate the vascular anatomy. CONTRAST:  75 mL Isovue 370 intravenous COMPARISON:  05/20/2011, 12/14/2016. FINDINGS: Cardiovascular: Satisfactory opacification of the pulmonary arteries to the segmental level. No evidence of pulmonary embolism. Borderline heart size. No pericardial effusion. Extensive atherosclerotic calcification of the aorta and coronary arteries Mediastinum/Nodes: No enlarged mediastinal, hilar, or axillary lymph nodes. Thyroid gland, trachea, and esophagus demonstrate no significant findings. Lungs/Pleura: No consolidation. No pleural effusion. There is interlobular septal thickening which is new. This most likely represents interstitial fluid. There is slight ground-glass opacity in the dependent lung bases, possibly a degree of alveolar edema. Upper Abdomen: No acute findings. Musculoskeletal: No significant skeletal lesion. Review of the MIP images confirms the above findings. IMPRESSION: 1. Negative for acute pulmonary embolism. 2. Interlobular septal thickening and slight ground-glass opacity in the dependent lung bases. This could represent interstitial and alveolar edema. 3. Extensive aortic and coronary atherosclerosis. Electronically Signed   By: Andreas Newport M.D.   On: 12/14/2016 20:32   Mm Diag Breast Tomo Uni Left  Result Date: 11/28/2016 CLINICAL DATA:  Six month followup left mammogram after ultrasound-guided biopsy. A left breast mass at 10:30 position was biopsied in March 2018 showing benign fat necrosis. EXAM: 2D DIGITAL DIAGNOSTIC UNILATERAL LEFT MAMMOGRAM WITH CAD AND ADJUNCT TOMO COMPARISON:  Previous exam(s). ACR Breast Density Category c: The breast tissue is heterogeneously dense,  which may obscure small masses. FINDINGS: Ribbon shaped biopsy clip is noted within the previously biopsied mass in the medial left breast. Coarse dystrophic calcifications have developed in this region of the left breast, consistent with benign fat necrosis. No suspicious mass, architectural distortion, or suspicious microcalcification is identified to suggest malignancy. Mammographic images were processed with CAD. IMPRESSION: Biopsy proven fat necrosis in the medial left breast contains calcifications that correlate with history of fat necrosis. No evidence of malignancy. RECOMMENDATION: Bilateral screening mammogram is recommended in March 2019. I have discussed the findings and recommendations with the patient. Results were also provided in writing at the conclusion of the visit. If applicable, a reminder letter will be sent to the patient regarding the next appointment. BI-RADS CATEGORY  2: Benign. Electronically Signed   By: Curlene Dolphin M.D.   On: 11/28/2016 09:54    Micro Results    Recent Results (from the past 240 hour(s))  MRSA PCR Screening     Status: None   Collection Time: 12/15/16  3:22 AM  Result Value Ref Range Status   MRSA by PCR NEGATIVE NEGATIVE Final    Comment:        The GeneXpert MRSA Assay (FDA approved for NASAL specimens only), is one component of a comprehensive MRSA colonization surveillance program. It is not intended to diagnose MRSA infection nor to guide or monitor treatment for MRSA infections.        Today   Subjective:   Alicia Patterson had cath and being transferred to [facility].  Objective:   Blood pressure 111/64, pulse 92, temperature 99.1 F (37.3 C),  temperature source Oral, resp. rate (!) 26, height 5' 3.5" (1.613 m), weight 63.8 kg (140 lb 10.5 oz), SpO2 95 %.   Intake/Output Summary (Last 24 hours) at 12/15/16 1708 Last data filed at 12/15/16 1500  Gross per 24 hour  Intake          1136.13 ml  Output              300 ml  Net            836.13 ml    Exam Awake Alert, Oriented x 3, No new F.N deficits, Normal affect Cope.AT,PERRAL Supple Neck,No JVD, No cervical lymphadenopathy appriciated.  Symmetrical Chest wall movement, Good air movement bilaterally, CTAB RRR,No Gallops,Rubs or new Murmurs, No Parasternal Heave +ve B.Sounds, Abd Soft, Non tender, No organomegaly appriciated, No rebound -guarding or rigidity. No Cyanosis, Clubbing or edema, No new Rash or bruise  Data Review   CBC w Diff: Lab Results  Component Value Date   WBC 11.9 (H) 12/15/2016   HGB 11.4 (L) 12/15/2016   HGB 12.9 05/20/2011   HCT 34.0 (L) 12/15/2016   HCT 38.9 05/20/2011   PLT 172 12/15/2016   PLT 180 05/20/2011    CMP: Lab Results  Component Value Date   NA 133 (L) 12/15/2016   NA 140 05/20/2011   K 3.5 12/15/2016   K 4.1 05/20/2011   CL 101 12/15/2016   CL 105 05/20/2011   CO2 24 12/15/2016   CO2 21 05/20/2011   BUN 14 12/15/2016   BUN 20 (H) 05/20/2011   CREATININE 0.84 12/15/2016   CREATININE 0.71 05/20/2011   PROT 7.2 12/29/2014   PROT 7.1 05/20/2011   ALBUMIN 4.1 12/29/2014   ALBUMIN 4.0 05/20/2011   BILITOT 0.6 12/29/2014   BILITOT 0.4 05/20/2011   ALKPHOS 115 12/29/2014   ALKPHOS 80 05/20/2011   AST 23 12/29/2014   AST 62 (H) 05/20/2011   ALT 21 12/29/2014   ALT 46 05/20/2011  .   Total Time in preparing paper work, data evaluation and todays exam - 71 minutes  KONIDENA,SNEHALATHA M.D on 12/15/2016 at 5:08 PM    Note: This dictation was prepared with Dragon dictation along with smaller phrase technology. Any transcriptional errors that result from this process are unintentional.

## 2016-12-15 NOTE — Progress Notes (Signed)
ANTICOAGULATION CONSULT NOTE - Initial Consult  Pharmacy Consult for heparin gtt Indication: chest pain/ACS  Allergies  Allergen Reactions  . Amoxicillin-Pot Clavulanate     Other reaction(s): Unknown  Pt states she is not allergic to this medication  . Cefdinir     Other reaction(s): Other (See Comments)  . Gatifloxacin     Other reaction(s): Unknown Other reaction(s): Other (See Comments)  . Sulfa Antibiotics Rash  . Sulfasalazine Rash    Patient Measurements: Height: 5' 3.5" (161.3 cm) Weight: 140 lb 10.5 oz (63.8 kg) IBW/kg (Calculated) : 53.55 Heparin Dosing Weight: 58.5kg  Vital Signs: Temp: 98.4 F (36.9 C) (10/15 0323) Temp Source: Oral (10/15 0323) BP: 89/63 (10/15 0515) Pulse Rate: 92 (10/15 0515)  Labs:  Recent Labs  12/14/16 1844 12/14/16 2128 12/15/16 0459  HGB 12.2  --  11.4*  HCT 36.6  --  34.0*  PLT 208  --  172  APTT  --  26  --   LABPROT  --  14.2  --   INR  --  1.11  --   HEPARINUNFRC  --   --  0.35  CREATININE 1.02*  --   --   TROPONINI 1.96*  --   --     Estimated Creatinine Clearance: 34.7 mL/min (A) (by C-G formula based on SCr of 1.02 mg/dL (H)).   Medical History: Past Medical History:  Diagnosis Date  . Cancer ([place])   . Coronary artery disease   . GERD (gastroesophageal reflux disease)   . Glaucoma     Medications:  Prescriptions Prior to Admission  Medication Sig Dispense Refill Last Dose  . aspirin EC 81 MG tablet Take 1 tablet by mouth daily.   12/14/2016 at Unknown time  . brimonidine-timolol (COMBIGAN) 0.2-0.5 % ophthalmic solution Place 1 drop into both eyes every 12 (twelve) hours.     . cyanocobalamin 500 MCG tablet Take 500 mcg by mouth daily.   12/14/2016 at Unknown time  . dorzolamide (TRUSOPT) 2 % ophthalmic solution Place 1 drop into both eyes daily.     Marland Kitchen latanoprost (XALATAN) 0.005 % ophthalmic solution Place 1 drop into both eyes daily.   12/13/2016 at Unknown time  . nitrofurantoin,  macrocrystal-monohydrate, (MACROBID) 100 MG capsule Take 1 capsule by mouth daily.  1 12/14/2016 at Unknown time  . omeprazole (PRILOSEC) 20 MG capsule Take 1 capsule by mouth daily.   12/14/2016 at Unknown time  . verapamil (CALAN-SR) 180 MG CR tablet Take 1 tablet by mouth daily.   12/14/2016 at Unknown time  . ciprofloxacin (CIPRO) 250 MG tablet Take 1 tablet by mouth 2 (two) times daily.  0 Completed Course at Unknown time  . clobetasol ointment (TEMOVATE) 3.57 % Apply 1 application topically every other day.  1   . ESTRACE VAGINAL 0.1 MG/GM vaginal cream Place 0.5 g vaginally 2 (two) times a week.  0    Scheduled:  . aspirin EC  325 mg Oral Daily  . atorvastatin  40 mg Oral q1800  . brimonidine-timolol  1 drop Both Eyes Q12H  . clobetasol ointment  1 application Topical QODAY  . dorzolamide  1 drop Both Eyes Daily  . latanoprost  1 drop Both Eyes Daily  . metoprolol tartrate  25 mg Oral Once  . pantoprazole  40 mg Oral Daily  . sodium chloride flush  3 mL Intravenous Q12H  . sodium chloride flush  3 mL Intravenous Q12H  . cyanocobalamin  500 mcg Oral Daily  Infusions:  . sodium chloride    . heparin 700 Units/hr (12/15/16 0500)  . nitroGLYCERIN     PRN:  Anti-infectives    None      Assessment: 83 year old female with ACS/NSTEMI, requiring heparin gtt with bolus per pharmacy protocol.   Goal of Therapy:  Heparin level 0.3-0.7 units/ml Monitor platelets by anticoagulation protocol: Yes   Plan:  Give 3500 units bolus x 1 Start heparin infusion at 600 units/hr Check anti-Xa level in 6 hours and daily while on heparin Continue to monitor H&H and platelets  10/15 @ 0459 HL 0.35 therapeutic. Heparin order was placed through the ED as a 'once' order, so drip fell off. Consult was placed back in and I dosed it 700 units/hr not realizing previous pharmacist has dosed 600 units/hr. This HL was 1 hour after drip was increased to 700 units/hr. Will recheck next HL @ 1200 and  adjust as necessary. Hgb trending down.  Tobie Lords, PharmD, BCPS Clinical Pharmacist 12/15/2016

## 2016-12-15 NOTE — Progress Notes (Signed)
Pt lying comfortably in bed in NAD, family at bedside, Dr. Humphrey Rolls at bedside to discuss procedure and plan of care.  Pt and family verbalize understanding.

## 2016-12-15 NOTE — H&P (Signed)
[facility] NAME: Alicia Patterson    MR#:  [id number]  DATE OF BIRTH:  Jul 26, 1930  DATE OF ADMISSION:  12/14/2016  PRIMARY CARE PHYSICIAN: Derinda Late, MD   REQUESTING/REFERRING PHYSICIAN:   CHIEF COMPLAINT:   Chief Complaint  Patient presents with  . Chest Pain  . Tingling  . Stress    husband died yesterday    HISTORY OF PRESENT ILLNESS: Alicia Patterson  is a 83 y.o. female with The low past medical history which includes coronary artery disease-last heart catheterization was greater than 10 years ago noted for minimal obstructive disease for which medical management was recommended at that time, paroxysmal supraventricular tachycardia, esophageal dysfunction, hyperlipidemia, anemia, presenting with recent loss of her husband on yesterday, noted acute mid sharp persistent chest pain radiating into the left shoulder, associated with shortness of breath/diaphoresis, noted heart palpitations, hand tingling, decreased by mouth intake, sleep deprivation given loss of level I, associated cough, emergency room patient was found to have BNP 588, troponin initially normal-second troponin 1.9, white count 13,000, EKG with benign changes/no evidence of acute coronary syndrome, chest x-ray noted for atelectasis, CT of the chest negative for PE/possible edema, possible cervicitis only contact further evaluation/care. Patient evaluated bedside in emergency room, Dora the bedside, patient was started on heparin drip in discussion with ER attending, noted low normal blood pressures with IV morphine, patient is now being admitted for acute non-STEMI with probable acute flash pulmonary edema, cannot exclude possible Takasubo's syndrome.  PAST MEDICAL HISTORY:   Past Medical History:  Diagnosis Date  . Cancer ([place])   . Coronary artery disease   . GERD (gastroesophageal reflux disease)   . Glaucoma     PAST SURGICAL HISTORY: Past Surgical History:   Procedure Laterality Date  . ABDOMINAL HYSTERECTOMY    . BREAST BIOPSY Left 05/22/2016   neg-ORGANIZING FAT NECROSIS. VASCULAR CALCS   . CHOLECYSTECTOMY    . TONSILLECTOMY      SOCIAL HISTORY:  Social History  Substance Use Topics  . Smoking status: Former Research scientist (life sciences)  . Smokeless tobacco: Never Used  . Alcohol use No    FAMILY HISTORY:  Family History  Problem Relation Age of Onset  . Breast cancer Sister 83  . Breast cancer Other 50    DRUG ALLERGIES:  Allergies  Allergen Reactions  . Amoxicillin-Pot Clavulanate     Other reaction(s): Unknown  Pt states she is not allergic to this medication  . Cefdinir     Other reaction(s): Other (See Comments)  . Gatifloxacin     Other reaction(s): Unknown Other reaction(s): Other (See Comments)  . Sulfa Antibiotics Rash  . Sulfasalazine Rash    REVIEW OF SYSTEMS:   CONSTITUTIONAL: No fever, Positive severe fatigue/generalized weakness.  EYES: No blurred or double vision.  EARS, NOSE, AND THROAT: No tinnitus or ear pain.  RESPIRATORY: + cough/shortness of breath, no wheezing or hemoptysis.  CARDIOVASCULAR: + chest pain, no orthopnea, edema.  GASTROINTESTINAL: +nausea, no vomiting, diarrhea or abdominal pain.  GENITOURINARY: No dysuria, hematuria.  ENDOCRINE: No polyuria, nocturia,  HEMATOLOGY: No anemia, easy bruising or bleeding SKIN: No rash or lesion. MUSCULOSKELETAL: No joint pain or arthritis.   NEUROLOGIC: No tingling, numbness, weakness.  PSYCHIATRY: No anxiety or depression.   MEDICATIONS AT HOME:  Prior to Admission medications   Medication Sig Start Date End Date Taking? Authorizing Provider  aspirin EC 81 MG tablet Take 1 tablet by mouth daily.   Yes [provider]  brimonidine-timolol (COMBIGAN) 0.2-0.5 % ophthalmic solution Place 1 drop into both eyes every 12 (twelve) hours.   Yes [provider]  cyanocobalamin 500 MCG tablet Take 500 mcg by mouth daily.   Yes [provider]   dorzolamide (TRUSOPT) 2 % ophthalmic solution Place 1 drop into both eyes daily. 08/31/15  Yes [provider]  latanoprost (XALATAN) 0.005 % ophthalmic solution Place 1 drop into both eyes daily.   Yes [provider]  nitrofurantoin, macrocrystal-monohydrate, (MACROBID) 100 MG capsule Take 1 capsule by mouth daily. 12/04/16  Yes [provider]  omeprazole (PRILOSEC) 20 MG capsule Take 1 capsule by mouth daily. 08/18/14  Yes [provider]  verapamil (CALAN-SR) 180 MG CR tablet Take 1 tablet by mouth daily. 10/22/14  Yes [provider]  ciprofloxacin (CIPRO) 250 MG tablet Take 1 tablet by mouth 2 (two) times daily. 11/27/16   [provider]  clobetasol ointment (TEMOVATE) 4.43 % Apply 1 application topically every other day. 11/06/16   [provider]  ESTRACE VAGINAL 0.1 MG/GM vaginal cream Place 0.5 g vaginally 2 (two) times a week. 12/08/16   [provider]      PHYSICAL EXAMINATION:   VITAL SIGNS: Blood pressure 101/72, pulse (!) 105, temperature 98.2 F (36.8 C), resp. rate 19, height 5' 3.5" (1.613 m), weight 58.5 kg (129 lb), SpO2 95 %.  GENERAL:  83 y.o.-year-old patient lying in the bed with Mild/moderate anxiety/stress, frail-appearing   EYES: Pupils equal, round, reactive to light and accommodation. No scleral icterus. Extraocular muscles intact.  HEENT: Head atraumatic, normocephalic. Oropharynx and nasopharynx clear.  NECK:  Supple, no jugular venous distention. No thyroid enlargement, no tenderness.  LUNGS:  diminished breath sounds at bases bilaterally, no wheezing, rales,rhonchi or crepitation. No use of accessory muscles of respiration.  CARDIOVASCULAR: S1, S2 normal. No murmurs, rubs, or gallops.  ABDOMEN: Soft, nontender, nondistended. Bowel sounds present. No organomegaly or mass.  EXTREMITIES:  bilateral mild  lower extremity edema, cyanosis, or clubbing.  NEUROLOGIC: Cranial nerves II through XII are  intact. MAES. Gait not checked.  PSYCHIATRIC: The patient is alert and oriented x 3. anxious appearing   SKIN: No obvious rash, lesion, or ulcer.   LABORATORY PANEL:   CBC  Recent Labs Lab 12/14/16 1844  WBC 13.6*  HGB 12.2  HCT 36.6  PLT 208  MCV 90.8  MCH 30.3  MCHC 33.4  RDW 14.2   ------------------------------------------------------------------------------------------------------------------  Chemistries   Recent Labs Lab 12/14/16 1844  NA 133*  K 4.1  CL 101  CO2 23  GLUCOSE 104*  BUN 15  CREATININE 1.02*  CALCIUM 8.4*   ------------------------------------------------------------------------------------------------------------------ estimated creatinine clearance is 34.7 mL/min (A) (by C-G formula based on SCr of 1.02 mg/dL (H)). ------------------------------------------------------------------------------------------------------------------ No results for input(s): TSH, T4TOTAL, T3FREE, THYROIDAB in the last 72 hours.  Invalid input(s): FREET3   Coagulation profile  Recent Labs Lab 12/14/16 2128  INR 1.11   ------------------------------------------------------------------------------------------------------------------- No results for input(s): DDIMER in the last 72 hours. -------------------------------------------------------------------------------------------------------------------  Cardiac Enzymes  Recent Labs Lab 12/14/16 1844  TROPONINI 1.96*   ------------------------------------------------------------------------------------------------------------------ Invalid input(s): POCBNP  ---------------------------------------------------------------------------------------------------------------  Urinalysis    Component Value Date/Time   COLORURINE YELLOW (A) 12/29/2014 1235   APPEARANCEUR CLOUDY (A) 12/29/2014 1235   APPEARANCEUR Turbid 05/20/2011 1655   LABSPEC 1.010 12/29/2014 1235   LABSPEC 1.009 05/20/2011 1655   PHURINE  5.0 12/29/2014 1235   GLUCOSEU NEGATIVE 12/29/2014 1235   GLUCOSEU Negative 05/20/2011 1655  HGBUR 1+ (A) 12/29/2014 1235   BILIRUBINUR NEGATIVE 12/29/2014 1235   BILIRUBINUR Negative 05/20/2011 1655   KETONESUR NEGATIVE 12/29/2014 1235   PROTEINUR NEGATIVE 12/29/2014 1235   NITRITE NEGATIVE 12/29/2014 1235   LEUKOCYTESUR 2+ (A) 12/29/2014 1235   LEUKOCYTESUR Negative 05/20/2011 1655     RADIOLOGY: Dg Chest 2 View  Result Date: 12/14/2016 CLINICAL DATA:  Chest pain and shortness of Breath EXAM: CHEST  2 VIEW COMPARISON:  05/20/2011 FINDINGS: Cardiac shadow is within normal limits. Aortic calcifications are again seen. Mild bibasilar atelectatic changes are noted. No focal confluent infiltrate or sizable effusion is noted. No acute bony abnormality is seen. IMPRESSION: Mild bibasilar atelectasis. Electronically Signed   By: Inez Catalina M.D.   On: 12/14/2016 19:28   Ct Angio Chest Pe W And/or Wo Contrast  Result Date: 12/14/2016 CLINICAL DATA:  Central chest pain, pleuritic. Radiation to the back. EXAM: CT ANGIOGRAPHY CHEST WITH CONTRAST TECHNIQUE: Multidetector CT imaging of the chest was performed using the standard protocol during bolus administration of intravenous contrast. Multiplanar CT image reconstructions and MIPs were obtained to evaluate the vascular anatomy. CONTRAST:  75 mL Isovue 370 intravenous COMPARISON:  05/20/2011, 12/14/2016. FINDINGS: Cardiovascular: Satisfactory opacification of the pulmonary arteries to the segmental level. No evidence of pulmonary embolism. Borderline heart size. No pericardial effusion. Extensive atherosclerotic calcification of the aorta and coronary arteries Mediastinum/Nodes: No enlarged mediastinal, hilar, or axillary lymph nodes. Thyroid gland, trachea, and esophagus demonstrate no significant findings. Lungs/Pleura: No consolidation. No pleural effusion. There is interlobular septal thickening which is new. This most likely represents  interstitial fluid. There is slight ground-glass opacity in the dependent lung bases, possibly a degree of alveolar edema. Upper Abdomen: No acute findings. Musculoskeletal: No significant skeletal lesion. Review of the MIP images confirms the above findings. IMPRESSION: 1. Negative for acute pulmonary embolism. 2. Interlobular septal thickening and slight ground-glass opacity in the dependent lung bases. This could represent interstitial and alveolar edema. 3. Extensive aortic and coronary atherosclerosis. Electronically Signed   By: Andreas Newport M.D.   On: 12/14/2016 20:32    EKG: Orders placed or performed during the hospital encounter of 12/14/16  . EKG 12-Lead  . EKG 12-Lead  . ED EKG within 10 minutes  . ED EKG within 10 minutes    IMPRESSION AND PLAN: 1 acute non-STEMI  With associated pulmonary edema, cannot exclude possible Takasubo's syndrome We'll admit to stepdown unit on our acute coronary syndrome protocol,Intensivist called for expert opinion, continue aspirin, heparin drip, nitroglycerin drip when necessary, IV morphine when necessary, supplemental oxygen via nasal cannula, continuous cycle cardiac enzymes, cardiology consult for expert opinion, check echocardiogram, beta blocker therapy, statin therapy-check lipids in the morning, Lasix 40 mg IV 1 now, and continue close medical monitoring  2 history of supraventricular tachycardia Stable Continue verapamil  3 chronic GERD without esophagitis PPI daily  4 chronic glaucoma Stable Continue Combigan, Trusopt, Xalatan  5 acute bereavement Lost her husband one day ago Klonopin when necessary anxiety Restoril at bedtime when necessary insomnia  Full code Condition guarded Prognosis poor DVT prophylaxis-on heparin drip Disposition home in 2-4 days   All the records are reviewed and case discussed with ED provider. Management plans discussed with the patient, family and they are in agreement.  CODE  STATUS: Code Status History    Date Active Date Inactive Code Status Order ID Comments User Context   12/29/2014 11:34 PM 01/01/2015  2:57 PM Partial Code [id number]  Leonie Green, MD Inpatient  Questions for Most Recent Historical Code Status (Order [id number])    Question Answer Comment   In the event of cardiac or respiratory ARREST: Initiate Code Blue, Call Rapid Response Yes    In the event of cardiac or respiratory ARREST: Perform CPR Yes    In the event of cardiac or respiratory ARREST: Perform Intubation/Mechanical Ventilation Yes    In the event of cardiac or respiratory ARREST: Use NIPPV/BiPAp only if indicated Yes    In the event of cardiac or respiratory ARREST: Administer ACLS medications if indicated Yes    In the event of cardiac or respiratory ARREST: Perform Defibrillation or Cardioversion if indicated Yes         Advance Directive Documentation     Most Recent Value  Type of Advance Directive  Healthcare Power of Attorney  Pre-existing out of facility DNR order (yellow form or pink MOST form)  -  "MOST" Form in Place?  -       TOTAL TIME TAKING CARE OF THIS PATIENT: 50 minutes.    Avel Peace Salary M.D on 12/15/2016   Between 7am to 6pm - Pager - [phone]  After 6pm go to [email or web address] - password [email or web address] [facility]  Office  [phone]  CC: Primary care physician; Derinda Late, MD   Note: This dictation was prepared with Dragon dictation along with smaller phrase technology. Any transcriptional errors that result from this process are unintentional.

## 2016-12-15 NOTE — Progress Notes (Signed)
ANTICOAGULATION CONSULT NOTE - Initial Consult  Pharmacy Consult for heparin gtt Indication: chest pain/ACS  Allergies  Allergen Reactions  . Amoxicillin-Pot Clavulanate     Other reaction(s): Unknown  Pt states she is not allergic to this medication  . Cefdinir     Other reaction(s): Other (See Comments)  . Gatifloxacin     Other reaction(s): Unknown Other reaction(s): Other (See Comments)  . Sulfa Antibiotics Rash  . Sulfasalazine Rash    Patient Measurements: Height: 5' 3.5" (161.3 cm) Weight: 140 lb 10.5 oz (63.8 kg) IBW/kg (Calculated) : 53.55 Heparin Dosing Weight: 58.5kg  Vital Signs: Temp: 99.1 F (37.3 C) (10/15 1200) Temp Source: Oral (10/15 1200) BP: 107/70 (10/15 1200) Pulse Rate: 87 (10/15 1200)  Labs:  Recent Labs  12/14/16 1844 12/14/16 2128 12/15/16 0459 12/15/16 0952 12/15/16 1146  HGB 12.2  --  11.4*  --   --   HCT 36.6  --  34.0*  --   --   PLT 208  --  172  --   --   APTT  --  26  --   --   --   LABPROT  --  14.2  --   --   --   INR  --  1.11  --   --   --   HEPARINUNFRC  --   --  0.35  --  0.38  CREATININE 1.02*  --  0.84  --   --   TROPONINI 1.96*  --  1.78* 2.78*  --     Estimated Creatinine Clearance: 42.2 mL/min (by C-G formula based on SCr of 0.84 mg/dL).   Medical History: Past Medical History:  Diagnosis Date  . Cancer ([place])   . Coronary artery disease   . GERD (gastroesophageal reflux disease)   . Glaucoma     Medications:  Prescriptions Prior to Admission  Medication Sig Dispense Refill Last Dose  . aspirin EC 81 MG tablet Take 1 tablet by mouth daily.   12/14/2016 at Unknown time  . brimonidine-timolol (COMBIGAN) 0.2-0.5 % ophthalmic solution Place 1 drop into both eyes every 12 (twelve) hours.     . cyanocobalamin 500 MCG tablet Take 500 mcg by mouth daily.   12/14/2016 at Unknown time  . dorzolamide (TRUSOPT) 2 % ophthalmic solution Place 1 drop into both eyes daily.     Marland Kitchen latanoprost (XALATAN) 0.005 % ophthalmic  solution Place 1 drop into both eyes daily.   12/13/2016 at Unknown time  . nitrofurantoin, macrocrystal-monohydrate, (MACROBID) 100 MG capsule Take 1 capsule by mouth daily.  1 12/14/2016 at Unknown time  . omeprazole (PRILOSEC) 20 MG capsule Take 1 capsule by mouth daily.   12/14/2016 at Unknown time  . verapamil (CALAN-SR) 180 MG CR tablet Take 1 tablet by mouth daily.   12/14/2016 at Unknown time  . ciprofloxacin (CIPRO) 250 MG tablet Take 1 tablet by mouth 2 (two) times daily.  0 Completed Course at Unknown time  . clobetasol ointment (TEMOVATE) 3.26 % Apply 1 application topically every other day.  1   . ESTRACE VAGINAL 0.1 MG/GM vaginal cream Place 0.5 g vaginally 2 (two) times a week.  0    Scheduled:  . aspirin EC  325 mg Oral Daily  . atorvastatin  40 mg Oral q1800  . brimonidine  1 drop Both Eyes Q12H   And  . timolol  1 drop Both Eyes Q12H  . cyanocobalamin  500 mcg Oral Daily  . dorzolamide  1 drop  Both Eyes Daily  . latanoprost  1 drop Both Eyes QHS  . metoprolol tartrate  25 mg Oral Once  . ondansetron (ZOFRAN) IV  4 mg Intravenous Once  . pantoprazole  40 mg Oral Daily  . sodium chloride flush  3 mL Intravenous Q12H  . triamcinolone   Topical QODAY   Infusions:  . sodium chloride    . heparin 700 Units/hr (12/15/16 0836)   PRN:  Anti-infectives    None      Assessment: 83 year old female with ACS/NSTEMI, requiring heparin gtt with bolus per pharmacy protocol.   Goal of Therapy:  Heparin level 0.3-0.7 units/ml Monitor platelets by anticoagulation protocol: Yes   Plan:  10/15 @ 1156 HL 0.38 therapeutic.  Continue heparin infusion at current rate of 700 units/hr.  Follow up after cardiac catheterization with plan for continued anticoagulation.  Oralia Manis 12/15/2016

## 2016-12-15 NOTE — Plan of Care (Signed)
Problem: Education: Goal: Knowledge of [facility] Education information/materials will improve Outcome: Progressing Pt and family oriented to the unit. Pt guide book provided and questions answered.    Problem: Safety: Goal: Ability to remain free from injury will improve Outcome: Progressing Pt has remained free from injury on my shift. Pt can verbalize understanding of how to use the call light.   Problem: Pain Managment: Goal: General experience of comfort will improve Outcome: Progressing Pt's pain level was controlled with nitro sub lingual.

## 2016-12-15 NOTE — Progress Notes (Signed)
Pt being transferred to [facility] cardiac step down, unit [id number]. Report called and given to Anda Kraft, Therapist, sports. Pt belongings sent with family.

## 2016-12-15 NOTE — Progress Notes (Signed)
Pt states she still having chest pain. Dr.Khan at bedside will continue to assess.

## 2016-12-15 NOTE — Progress Notes (Signed)
Spoke with Lindsay from [facility] transfer center. Pt assigned to room 7206 at [facility] Phone number to call report [phone].

## 2016-12-15 NOTE — Consult Note (Signed)
PULMONARY / CRITICAL CARE MEDICINE   Name: Alicia Patterson MRN: [id number] DOB: 24-Jun-1930    ADMISSION DATE:  12/14/2016   CONSULTATION DATE:  12/15/2016  REFERRING MD: Dr. Jerelyn Charles    REASON: Chest pain  CHIEF COMPLAINT: chest pain  HISTORY OF PRESENT ILLNESS:   This is an 83 year old Caucasian female with a past medical history as indicated below, who presented to the ED with complaints of sudden onset. Patient's husband died yesterday. She describes pain as sharp and stabbing, 10 and 10 in intensity at onset, associated with shortness of breath, numbness of her fingers, tremors and palpitations and worse when she takes a deep breath.nly alleviating factors other medications. She was given in the emergency room.she denies having similar symptoms in the past.she took 2 tablets of verapamil at onset of symptoms without any relief. Hence she decided to call EMS. At the ED her troponin was elevated at 1.96, EKG showed mild T-wave inversions but no ST elevations. She was ruled in for an non-ST elevation MI.she reports significant improvement in pain. She states that the last 2 days have been very emotional for her.  PAST MEDICAL HISTORY :  She  has a past medical history of Cancer ([place]); Coronary artery disease; GERD (gastroesophageal reflux disease); and Glaucoma.  PAST SURGICAL HISTORY: She  has a past surgical history that includes Cholecystectomy; Tonsillectomy; Abdominal hysterectomy; and Breast biopsy (Left, 05/22/2016).  Allergies  Allergen Reactions  . Amoxicillin-Pot Clavulanate     Other reaction(s): Unknown  Pt states she is not allergic to this medication  . Cefdinir     Other reaction(s): Other (See Comments)  . Gatifloxacin     Other reaction(s): Unknown Other reaction(s): Other (See Comments)  . Sulfa Antibiotics Rash  . Sulfasalazine Rash    No current facility-administered medications on file prior to encounter.    Current Outpatient Prescriptions on File Prior to  Encounter  Medication Sig  . aspirin EC 81 MG tablet Take 1 tablet by mouth daily.  Marland Kitchen latanoprost (XALATAN) 0.005 % ophthalmic solution Place 1 drop into both eyes daily.  Marland Kitchen omeprazole (PRILOSEC) 20 MG capsule Take 1 capsule by mouth daily.  . verapamil (CALAN-SR) 180 MG CR tablet Take 1 tablet by mouth daily.    FAMILY HISTORY:  Her indicated that the status of her sister is unknown. She indicated that the status of her other is unknown.    SOCIAL HISTORY: She  reports that she has quit smoking. She has never used smokeless tobacco. She reports that she does not drink alcohol or use drugs.  REVIEW OF SYSTEMS:   Constitutional: Negative for fever and chills.  HENT: Negative for congestion and rhinorrhea.  Eyes: Negative for redness and visual disturbance.  Respiratory: Negative for shortness of breath and wheezing.  Cardiovascular:Positive for chest pain and palpitations.  Gastrointestinal: Negative  for nausea , vomiting and abdominal pain and  Loose stools Genitourinary: Negative for dysuria and urgency.  Endocrine: Denies polyuria, polyphagia and heat intolerance Musculoskeletal: Negative for myalgias and arthralgias.  Skin: Negative for pallor and wound.  Neurological: Negative for dizziness and headaches   SUBJECTIVE:   VITAL SIGNS: BP (!) 89/63   Pulse 92   Temp 98.4 F (36.9 C) (Oral)   Resp 16   Ht 5' 3.5" (1.613 m)   Wt 140 lb 10.5 oz (63.8 kg)   SpO2 96%   BMI 24.52 kg/m   HEMODYNAMICS:    VENTILATOR SETTINGS:    INTAKE / OUTPUT: No intake/output  data recorded.  PHYSICAL EXAMINATION: General:  lying in bed, no acute distress Neuro: Alert and oriented 4, no focal deficits HEENT:PERRLA, oral mucosa dry, no JVD Cardiovascular: RRR, S1/S2, no MRG, +2 pulses, no edema Lungs:  Normal work of breathing, lungs are clear to auscultation  bilaterally Abdomen:  nondistended, normal bowel sounds. No focal Musculoskeletal:  No deformity, no joint  swelling Skin: warm and dry  LABS:  BMET  Recent Labs Lab 12/14/16 1844  NA 133*  K 4.1  CL 101  CO2 23  BUN 15  CREATININE 1.02*  GLUCOSE 104*    Electrolytes  Recent Labs Lab 12/14/16 1844  CALCIUM 8.4*    CBC  Recent Labs Lab 12/14/16 1844 12/15/16 0459  WBC 13.6* 11.9*  HGB 12.2 11.4*  HCT 36.6 34.0*  PLT 208 172    Coag's  Recent Labs Lab 12/14/16 2128  APTT 26  INR 1.11    Sepsis Markers No results for input(s): LATICACIDVEN, PROCALCITON, O2SATVEN in the last 168 hours.  ABG No results for input(s): PHART, PCO2ART, PO2ART in the last 168 hours.  Liver Enzymes No results for input(s): AST, ALT, ALKPHOS, BILITOT, ALBUMIN in the last 168 hours.  Cardiac Enzymes  Recent Labs Lab 12/14/16 1844 12/15/16 0459  TROPONINI 1.96* 1.78*    Glucose No results for input(s): GLUCAP in the last 168 hours.  Imaging Dg Chest 2 View  Result Date: 12/14/2016 CLINICAL DATA:  Chest pain and shortness of Breath EXAM: CHEST  2 VIEW COMPARISON:  05/20/2011 FINDINGS: Cardiac shadow is within normal limits. Aortic calcifications are again seen. Mild bibasilar atelectatic changes are noted. No focal confluent infiltrate or sizable effusion is noted. No acute bony abnormality is seen. IMPRESSION: Mild bibasilar atelectasis. Electronically Signed   By: Inez Catalina M.D.   On: 12/14/2016 19:28   Ct Angio Chest Pe W And/or Wo Contrast  Result Date: 12/14/2016 CLINICAL DATA:  Central chest pain, pleuritic. Radiation to the back. EXAM: CT ANGIOGRAPHY CHEST WITH CONTRAST TECHNIQUE: Multidetector CT imaging of the chest was performed using the standard protocol during bolus administration of intravenous contrast. Multiplanar CT image reconstructions and MIPs were obtained to evaluate the vascular anatomy. CONTRAST:  75 mL Isovue 370 intravenous COMPARISON:  05/20/2011, 12/14/2016. FINDINGS: Cardiovascular: Satisfactory opacification of the pulmonary arteries to the  segmental level. No evidence of pulmonary embolism. Borderline heart size. No pericardial effusion. Extensive atherosclerotic calcification of the aorta and coronary arteries Mediastinum/Nodes: No enlarged mediastinal, hilar, or axillary lymph nodes. Thyroid gland, trachea, and esophagus demonstrate no significant findings. Lungs/Pleura: No consolidation. No pleural effusion. There is interlobular septal thickening which is new. This most likely represents interstitial fluid. There is slight ground-glass opacity in the dependent lung bases, possibly a degree of alveolar edema. Upper Abdomen: No acute findings. Musculoskeletal: No significant skeletal lesion. Review of the MIP images confirms the above findings. IMPRESSION: 1. Negative for acute pulmonary embolism. 2. Interlobular septal thickening and slight ground-glass opacity in the dependent lung bases. This could represent interstitial and alveolar edema. 3. Extensive aortic and coronary atherosclerosis. Electronically Signed   By: Andreas Newport M.D.   On: 12/14/2016 20:32   STUDIES:  2-D echo pending  CULTURES: none  ANTIBIOTICS: none  SIGNIFICANT EVENTS: 12/14/16: admitted  LINES/TUBES: Peripheral IVs  DISCUSSION: 83 year old female with a history of coronary artery disease presenting with an acute non-ST elevation MI and pulmonary edema. Currently hemodynamically stable  ASSESSMENT  Non-ST elevation MI Pulmonary edema. Persistent chest pain. History of  coronary artery disease  PLAN Hemodynamics per ICU protocol Discontinue Nitroglycerin infusion and start topical nitroglycerin for chest pain, and systolic blood pressure greater than 100 Nitroglycerin as needed for chest pain. 2-D echo Heparin infusion per ACS protocol Beta blocker, aspirin, and statin Morphine as needed for chest pain Resume all home medications GI and DVT prophylaxis Further changes in treatment plan pending clinical course and diagnostics  Patient  is stable for transfer to telemetry  FAMILY  - Updates: patient and daughter updated at bedside  - Inter-disciplinary family meet or Palliative Care meeting due by:  day [place]. [facility] ANP-BC Pulmonary and Critical Care Medicine [facility] Pager [phone] or [phone]  12/15/2016, 5:59 AM   Merton Border, MD PCCM service Mobile [phone] Pager [phone] 12/15/2016 6:11 PM

## 2016-12-15 NOTE — Consult Note (Signed)
Alicia Patterson is a 83 y.o. female  [id number]  Primary Cardiologist: Haliyah Fryman  Reason for Consultation: NSTEMI  HPI:86 YOWF presented with chest pain and shortness of breath aafter family member passed away.   Review of Systems:SOB/PND   Past Medical History:  Diagnosis Date  . Cancer ([place])   . Coronary artery disease   . GERD (gastroesophageal reflux disease)   . Glaucoma     Medications Prior to Admission  Medication Sig Dispense Refill  . aspirin EC 81 MG tablet Take 1 tablet by mouth daily.    . brimonidine-timolol (COMBIGAN) 0.2-0.5 % ophthalmic solution Place 1 drop into both eyes every 12 (twelve) hours.    . cyanocobalamin 500 MCG tablet Take 500 mcg by mouth daily.    . dorzolamide (TRUSOPT) 2 % ophthalmic solution Place 1 drop into both eyes daily.    Marland Kitchen latanoprost (XALATAN) 0.005 % ophthalmic solution Place 1 drop into both eyes daily.    . nitrofurantoin, macrocrystal-monohydrate, (MACROBID) 100 MG capsule Take 1 capsule by mouth daily.  1  . omeprazole (PRILOSEC) 20 MG capsule Take 1 capsule by mouth daily.    . verapamil (CALAN-SR) 180 MG CR tablet Take 1 tablet by mouth daily.    . ciprofloxacin (CIPRO) 250 MG tablet Take 1 tablet by mouth 2 (two) times daily.  0  . clobetasol ointment (TEMOVATE) 8.09 % Apply 1 application topically every other day.  1  . ESTRACE VAGINAL 0.1 MG/GM vaginal cream Place 0.5 g vaginally 2 (two) times a week.  0     . aspirin EC  325 mg Oral Daily  . atorvastatin  40 mg Oral q1800  . brimonidine  1 drop Both Eyes Q12H   And  . timolol  1 drop Both Eyes Q12H  . dorzolamide  1 drop Both Eyes Daily  . latanoprost  1 drop Both Eyes Daily  . metoprolol tartrate  25 mg Oral Once  . pantoprazole  40 mg Oral Daily  . sodium chloride flush  3 mL Intravenous Q12H  . sodium chloride flush  3 mL Intravenous Q12H  . triamcinolone   Topical QODAY  . cyanocobalamin  500 mcg Oral Daily    Infusions: . sodium chloride    .  heparin 700 Units/hr (12/15/16 9833)    Allergies  Allergen Reactions  . Amoxicillin-Pot Clavulanate     Other reaction(s): Unknown  Pt states she is not allergic to this medication  . Cefdinir     Other reaction(s): Other (See Comments)  . Gatifloxacin     Other reaction(s): Unknown Other reaction(s): Other (See Comments)  . Sulfa Antibiotics Rash  . Sulfasalazine Rash    Social History   Social History  . Marital status: Widowed    Spouse name: N/A  . Number of children: N/A  . Years of education: N/A   Occupational History  . Not on file.   Social History Main Topics  . Smoking status: Former Research scientist (life sciences)  . Smokeless tobacco: Never Used  . Alcohol use No  . Drug use: No  . Sexual activity: Not Currently   Other Topics Concern  . Not on file   Social History Narrative  . No narrative on file    Family History  Problem Relation Age of Onset  . Breast cancer Sister 42  . Breast cancer Other 50    PHYSICAL EXAM: Vitals:   12/15/16 0800 12/15/16 0836  BP: 99/65 (!) 97/59  Pulse: 89  Resp:  16  Temp:  98.9 F (37.2 C)  SpO2: 96%      Intake/Output Summary (Last 24 hours) at 12/15/16 1003 Last data filed at 12/15/16 0836  Gross per 24 hour  Intake          1133.13 ml  Output                0 ml  Net          1133.13 ml    General:  Well appearing. No respiratory difficulty HEENT: normal Neck: supple. no JVD. Carotids 2+ bilat; no bruits. No lymphadenopathy or thryomegaly appreciated. Cor: PMI nondisplaced. Regular rate & rhythm. No rubs, gallops or murmurs. Lungs: clear Abdomen: soft, nontender, nondistended. No hepatosplenomegaly. No bruits or masses. Good bowel sounds. Extremities: no cyanosis, clubbing, rash, edema Neuro: alert & oriented x 3, cranial nerves grossly intact. moves all 4 extremities w/o difficulty. Affect pleasant.  ECG:NSR low voltage, non-specific st and t changes  Results for orders placed or performed during the hospital  encounter of 12/14/16 (from the past 24 hour(s))  Basic metabolic panel     Status: Abnormal   Collection Time: 12/14/16  6:44 PM  Result Value Ref Range   Sodium 133 (L) 135 - 145 mmol/L   Potassium 4.1 3.5 - 5.1 mmol/L   Chloride 101 101 - 111 mmol/L   CO2 23 22 - 32 mmol/L   Glucose, Bld 104 (H) 65 - 99 mg/dL   BUN 15 6 - 20 mg/dL   Creatinine, Ser 1.02 (H) 0.44 - 1.00 mg/dL   Calcium 8.4 (L) 8.9 - 10.3 mg/dL   GFR calc non Af Amer 49 (L) >60 mL/min   GFR calc Af Amer 57 (L) >60 mL/min   Anion gap 9 5 - 15  CBC     Status: Abnormal   Collection Time: 12/14/16  6:44 PM  Result Value Ref Range   WBC 13.6 (H) 3.6 - 11.0 K/uL   RBC 4.03 3.80 - 5.20 MIL/uL   Hemoglobin 12.2 12.0 - 16.0 g/dL   HCT 36.6 35.0 - 47.0 %   MCV 90.8 80.0 - 100.0 fL   MCH 30.3 26.0 - 34.0 pg   MCHC 33.4 32.0 - 36.0 g/dL   RDW 14.2 11.5 - 14.5 %   Platelets 208 150 - 440 K/uL  Troponin I     Status: Abnormal   Collection Time: 12/14/16  6:44 PM  Result Value Ref Range   Troponin I 1.96 (HH) <0.03 ng/mL  Brain natriuretic peptide     Status: Abnormal   Collection Time: 12/14/16  8:58 PM  Result Value Ref Range   B Natriuretic Peptide 588.0 (H) 0.0 - 100.0 pg/mL  Protime-INR     Status: None   Collection Time: 12/14/16  9:28 PM  Result Value Ref Range   Prothrombin Time 14.2 11.4 - 15.2 seconds   INR 1.11   APTT     Status: None   Collection Time: 12/14/16  9:28 PM  Result Value Ref Range   aPTT 26 24 - 36 seconds  MRSA PCR Screening     Status: None   Collection Time: 12/15/16  3:22 AM  Result Value Ref Range   MRSA by PCR NEGATIVE NEGATIVE  Glucose, capillary     Status: Abnormal   Collection Time: 12/15/16  3:22 AM  Result Value Ref Range   Glucose-Capillary 118 (H) 65 - 99 mg/dL  Heparin level (unfractionated)  Status: None   Collection Time: 12/15/16  4:59 AM  Result Value Ref Range   Heparin Unfractionated 0.35 0.30 - 0.70 IU/mL  Lipid panel     Status: None   Collection Time:  12/15/16  4:59 AM  Result Value Ref Range   Cholesterol 144 0 - 200 mg/dL   Triglycerides 27 <150 mg/dL   HDL 47 >40 mg/dL   Total CHOL/HDL Ratio 3.1 RATIO   VLDL 5 0 - 40 mg/dL   LDL Cholesterol 92 0 - 99 mg/dL  Basic metabolic panel     Status: Abnormal   Collection Time: 12/15/16  4:59 AM  Result Value Ref Range   Sodium 133 (L) 135 - 145 mmol/L   Potassium 3.5 3.5 - 5.1 mmol/L   Chloride 101 101 - 111 mmol/L   CO2 24 22 - 32 mmol/L   Glucose, Bld 153 (H) 65 - 99 mg/dL   BUN 14 6 - 20 mg/dL   Creatinine, Ser 0.84 0.44 - 1.00 mg/dL   Calcium 8.0 (L) 8.9 - 10.3 mg/dL   GFR calc non Af Amer >60 >60 mL/min   GFR calc Af Amer >60 >60 mL/min   Anion gap 8 5 - 15  Magnesium     Status: None   Collection Time: 12/15/16  4:59 AM  Result Value Ref Range   Magnesium 1.7 1.7 - 2.4 mg/dL  TSH     Status: None   Collection Time: 12/15/16  4:59 AM  Result Value Ref Range   TSH 0.731 0.350 - 4.500 uIU/mL  CBC     Status: Abnormal   Collection Time: 12/15/16  4:59 AM  Result Value Ref Range   WBC 11.9 (H) 3.6 - 11.0 K/uL   RBC 3.74 (L) 3.80 - 5.20 MIL/uL   Hemoglobin 11.4 (L) 12.0 - 16.0 g/dL   HCT 34.0 (L) 35.0 - 47.0 %   MCV 90.9 80.0 - 100.0 fL   MCH 30.5 26.0 - 34.0 pg   MCHC 33.5 32.0 - 36.0 g/dL   RDW 13.9 11.5 - 14.5 %   Platelets 172 150 - 440 K/uL  Troponin I (q 6hr x 3)     Status: Abnormal   Collection Time: 12/15/16  4:59 AM  Result Value Ref Range   Troponin I 1.78 (HH) <0.03 ng/mL   Dg Chest 2 View  Result Date: 12/14/2016 CLINICAL DATA:  Chest pain and shortness of Breath EXAM: CHEST  2 VIEW COMPARISON:  05/20/2011 FINDINGS: Cardiac shadow is within normal limits. Aortic calcifications are again seen. Mild bibasilar atelectatic changes are noted. No focal confluent infiltrate or sizable effusion is noted. No acute bony abnormality is seen. IMPRESSION: Mild bibasilar atelectasis. Electronically Signed   By: Inez Catalina M.D.   On: 12/14/2016 19:28   Ct Angio Chest  Pe W And/or Wo Contrast  Result Date: 12/14/2016 CLINICAL DATA:  Central chest pain, pleuritic. Radiation to the back. EXAM: CT ANGIOGRAPHY CHEST WITH CONTRAST TECHNIQUE: Multidetector CT imaging of the chest was performed using the standard protocol during bolus administration of intravenous contrast. Multiplanar CT image reconstructions and MIPs were obtained to evaluate the vascular anatomy. CONTRAST:  75 mL Isovue 370 intravenous COMPARISON:  05/20/2011, 12/14/2016. FINDINGS: Cardiovascular: Satisfactory opacification of the pulmonary arteries to the segmental level. No evidence of pulmonary embolism. Borderline heart size. No pericardial effusion. Extensive atherosclerotic calcification of the aorta and coronary arteries Mediastinum/Nodes: No enlarged mediastinal, hilar, or axillary lymph nodes. Thyroid gland, trachea, and esophagus demonstrate no  significant findings. Lungs/Pleura: No consolidation. No pleural effusion. There is interlobular septal thickening which is new. This most likely represents interstitial fluid. There is slight ground-glass opacity in the dependent lung bases, possibly a degree of alveolar edema. Upper Abdomen: No acute findings. Musculoskeletal: No significant skeletal lesion. Review of the MIP images confirms the above findings. IMPRESSION: 1. Negative for acute pulmonary embolism. 2. Interlobular septal thickening and slight ground-glass opacity in the dependent lung bases. This could represent interstitial and alveolar edema. 3. Extensive aortic and coronary atherosclerosis. Electronically Signed   By: Andreas Newport M.D.   On: 12/14/2016 20:32     ASSESSMENT AND PLAN: NSTEMI/ Chest pain at rest,advise cath today. Markela Wee A

## 2016-12-15 NOTE — Progress Notes (Signed)
Patient is admitted for chest pain, non-ST elevation MI. At this time still complains of shortness of breath when she takes deep breath her chest is hurting on the left side. She is under a lot of anxiety because her husband passed away 12-Jun-2022.  #1 left-sided chest pain with non-ST elevation MI with elevated troponins up to 2.78. Continue heparin drip, aspirin, metoprolol, statins, appreciate cardiology consult. #2 depression, anxiety: Continue Klonopin, needs counseling. Patient may have a lot of anxiety, adjustment problems due to her husband's death recently. Time spent 25 minutes.

## 2016-12-15 NOTE — ED Notes (Signed)
Patient is resting comfortably. 

## 2016-12-16 ENCOUNTER — Encounter: Payer: Self-pay | Admitting: Cardiovascular Disease

## 2016-12-16 DIAGNOSIS — I5021 Acute systolic (congestive) heart failure: Secondary | ICD-10-CM | POA: Insufficient documentation

## 2016-12-16 DIAGNOSIS — I2510 Atherosclerotic heart disease of native coronary artery without angina pectoris: Secondary | ICD-10-CM | POA: Insufficient documentation

## 2016-12-16 LAB — ECHOCARDIOGRAM COMPLETE
Height: 63.5 in
Weight: 2250.46 oz

## 2016-12-23 DIAGNOSIS — Z95810 Presence of automatic (implantable) cardiac defibrillator: Secondary | ICD-10-CM | POA: Insufficient documentation

## 2017-01-07 DIAGNOSIS — Z955 Presence of coronary angioplasty implant and graft: Secondary | ICD-10-CM | POA: Insufficient documentation

## 2017-02-09 ENCOUNTER — Ambulatory Visit: Payer: Medicare Other

## 2017-02-16 ENCOUNTER — Encounter: Payer: Self-pay | Admitting: *Deleted

## 2017-02-16 ENCOUNTER — Encounter: Payer: Medicare Other | Attending: Cardiology | Admitting: *Deleted

## 2017-02-16 VITALS — Ht 62.9 in | Wt 135.2 lb

## 2017-02-16 DIAGNOSIS — Z955 Presence of coronary angioplasty implant and graft: Secondary | ICD-10-CM | POA: Diagnosis not present

## 2017-02-16 DIAGNOSIS — Z48812 Encounter for surgical aftercare following surgery on the circulatory system: Secondary | ICD-10-CM | POA: Insufficient documentation

## 2017-02-16 DIAGNOSIS — I252 Old myocardial infarction: Secondary | ICD-10-CM | POA: Insufficient documentation

## 2017-02-16 DIAGNOSIS — I214 Non-ST elevation (NSTEMI) myocardial infarction: Secondary | ICD-10-CM

## 2017-02-16 DIAGNOSIS — H409 Unspecified glaucoma: Secondary | ICD-10-CM | POA: Insufficient documentation

## 2017-02-16 NOTE — Patient Instructions (Signed)
Patient Instructions  Patient Details  Name: Alicia Patterson MRN: [id number] Date of Birth: [birth year] Referring Provider:  Isaias Cowman, MD  Below are the personal goals you chose as well as exercise and nutrition goals. Our goal is to help you keep on track towards obtaining and maintaining your goals. We will be discussing your progress on these goals with you throughout the program.  Initial Exercise Prescription: Initial Exercise Prescription - 02/16/17 1500      Date of Initial Exercise RX and Referring Provider   Date  02/16/17    Referring Provider  Paraschos, Alexander MD      Treadmill   MPH  2    Grade  0.5    Minutes  15    METs  2.67      Recumbant Bike   Level  1    RPM  50    Minutes  15    METs  1.7      NuStep   Level  1    SPM  80    Minutes  15    METs  1.7      Prescription Details   Frequency (times per week)  3    Duration  Progress to 45 minutes of aerobic exercise without signs/symptoms of physical distress      Intensity   THRR 40-80% of Max Heartrate  90-121    Ratings of Perceived Exertion  11-13    Perceived Dyspnea  0-4      Progression   Progression  Continue to progress workloads to maintain intensity without signs/symptoms of physical distress.      Resistance Training   Training Prescription  Yes    Weight  3 lbs    Reps  10-15       Exercise Goals: Frequency: Be able to perform aerobic exercise three times per week working toward 3-5 days per week.  Intensity: Work with a perceived exertion of 11 (fairly light) - 15 (hard) as tolerated. Follow your new exercise prescription and watch for changes in prescription as you progress with the program. Changes will be reviewed with you when they are made.  Duration: You should be able to do 30 minutes of continuous aerobic exercise in addition to a 5 minute warm-up and a 5 minute cool-down routine.  Nutrition Goals: Your personal nutrition goals will be established when you  do your nutrition analysis with the dietician.  The following are nutrition guidelines to follow: Cholesterol < 200mg /day Sodium < 1500mg /day Fiber: Women over 50 yrs - 21 grams per day  Personal Goals: Personal Goals and Risk Factors at Admission - 02/16/17 1358      Core Components/Risk Factors/Patient Goals on Admission    Weight Management  Yes;Weight Loss    Intervention  Weight Management: Develop a combined nutrition and exercise program designed to reach desired caloric intake, while maintaining appropriate intake of nutrient and fiber, sodium and fats, and appropriate energy expenditure required for the weight goal.;Weight Management: Provide education and appropriate resources to help participant work on and attain dietary goals.;Weight Management/Obesity: Establish reasonable short term and long term weight goals.    Admit Weight  129 lb (58.5 kg) weighs at home every morning and keeps log    Goal Weight: Short Term  125 lb (56.7 kg)    Goal Weight: Long Term  120 lb (54.4 kg)    Expected Outcomes  Short Term: Continue to assess and modify interventions until short term weight is achieved;Long  Term: Adherence to nutrition and physical activity/exercise program aimed toward attainment of established weight goal;Weight Loss: Understanding of general recommendations for a balanced deficit meal plan, which promotes 1-2 lb weight loss per week and includes a negative energy balance of [phone] kcal/d;Understanding recommendations for meals to include 15-35% energy as protein, 25-35% energy from fat, 35-60% energy from carbohydrates, less than 200mg  of dietary cholesterol, 20-35 gm of total fiber daily;Understanding of distribution of calorie intake throughout the day with the consumption of 4-5 meals/snacks    Lipids  Yes    Intervention  Provide education and support for participant on nutrition & aerobic/resistive exercise along with prescribed medications to achieve LDL 70mg , HDL >40mg .     Expected Outcomes  Short Term: Participant states understanding of desired cholesterol values and is compliant with medications prescribed. Participant is following exercise prescription and nutrition guidelines.;Long Term: Cholesterol controlled with medications as prescribed, with individualized exercise RX and with personalized nutrition plan. Value goals: LDL < 70mg , HDL > 40 mg.    Stress  Yes Husban recently passed away (same day she had her NSTEMI). She is trying to figure out his trust and all the financial/emotional differences after losing a spouse.     Intervention  Offer individual and/or small group education and counseling on adjustment to heart disease, stress management and health-related lifestyle change. Teach and support self-help strategies.;Refer participants experiencing significant psychosocial distress to appropriate mental health specialists for further evaluation and treatment. When possible, include family members and significant others in education/counseling sessions.    Expected Outcomes  Short Term: Participant demonstrates changes in health-related behavior, relaxation and other stress management skills, ability to obtain effective social support, and compliance with psychotropic medications if prescribed.;Long Term: Emotional wellbeing is indicated by absence of clinically significant psychosocial distress or social isolation.       Tobacco Use Initial Evaluation: Social History   Tobacco Use  Smoking Status Former Smoker  . Last attempt to quit: 1979  . Years since quitting: 39.9  Smokeless Tobacco Never Used    Exercise Goals and Review: Exercise Goals    Row Name 02/16/17 1558             Exercise Goals   Increase Physical Activity  Yes       Intervention  Provide advice, education, support and counseling about physical activity/exercise needs.;Develop an individualized exercise prescription for aerobic and resistive training based on initial  evaluation findings, risk stratification, comorbidities and participant's personal goals.       Expected Outcomes  Achievement of increased cardiorespiratory fitness and enhanced flexibility, muscular endurance and strength shown through measurements of functional capacity and personal statement of participant.       Increase Strength and Stamina  Yes       Intervention  Provide advice, education, support and counseling about physical activity/exercise needs.;Develop an individualized exercise prescription for aerobic and resistive training based on initial evaluation findings, risk stratification, comorbidities and participant's personal goals.       Expected Outcomes  Achievement of increased cardiorespiratory fitness and enhanced flexibility, muscular endurance and strength shown through measurements of functional capacity and personal statement of participant.       Able to understand and use rate of perceived exertion (RPE) scale  Yes       Intervention  Provide education and explanation on how to use RPE scale       Expected Outcomes  Short Term: Able to use RPE daily in rehab to express subjective intensity  level;Long Term:  Able to use RPE to guide intensity level when exercising independently       Knowledge and understanding of Target Heart Rate Range (THRR)  Yes       Intervention  Provide education and explanation of THRR including how the numbers were predicted and where they are located for reference       Expected Outcomes  Short Term: Able to state/look up THRR;Short Term: Able to use daily as guideline for intensity in rehab;Long Term: Able to use THRR to govern intensity when exercising independently       Able to check pulse independently  Yes       Intervention  Provide education and demonstration on how to check pulse in carotid and radial arteries.;Review the importance of being able to check your own pulse for safety during independent exercise       Expected Outcomes  Short Term:  Able to explain why pulse checking is important during independent exercise;Long Term: Able to check pulse independently and accurately       Understanding of Exercise Prescription  Yes       Intervention  Provide education, explanation, and written materials on patient's individual exercise prescription       Expected Outcomes  Short Term: Able to explain program exercise prescription;Long Term: Able to explain home exercise prescription to exercise independently          Copy of goals given to participant.
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
## 2017-02-18 DIAGNOSIS — Z48812 Encounter for surgical aftercare following surgery on the circulatory system: Secondary | ICD-10-CM | POA: Diagnosis not present

## 2017-02-18 DIAGNOSIS — I214 Non-ST elevation (NSTEMI) myocardial infarction: Secondary | ICD-10-CM

## 2017-02-18 NOTE — Progress Notes (Signed)
Daily Session Note  Patient Details  Name: Alicia Patterson MRN: [id number] Date of Birth: Jul 10, 1930 Referring Provider:     Cardiac Rehab from 02/16/2017 in [facility] Cardiac and Pulmonary Rehab  Referring Provider  Isaias Cowman MD      Encounter Date: 02/18/2017  Check In: Session Check In - 02/18/17 0727      Check-In   Location  [facility]-Cardiac & Pulmonary Rehab    Staff Present  Justin Mend Lorre Nick, MA, ACSM RCEP, Exercise Physiologist;Susanne Bice, RN, BSN, CCRP    Supervising physician immediately available to respond to emergencies  See telemetry face sheet for immediately available ER MD    Medication changes reported      No    Fall or balance concerns reported     No    Warm-up and Cool-down  Performed on first and last piece of equipment    Resistance Training Performed  Yes    VAD Patient?  No      Pain Assessment   Currently in Pain?  No/denies          Social History   Tobacco Use  Smoking Status Former Smoker  . Last attempt to quit: 1979  . Years since quitting: 39.9  Smokeless Tobacco Never Used    Goals Met:  Independence with exercise equipment Exercise tolerated well No report of cardiac concerns or symptoms Strength training completed today  Goals Unmet:  Not Applicable  Comments: First full day of exercise!  Patient was oriented to gym and equipment including functions, settings, policies, and procedures.  Patient's individual exercise prescription and treatment plan were reviewed.  All starting workloads were established based on the results of the 6 minute walk test done at initial orientation visit.  The plan for exercise progression was also introduced and progression will be customized based on patient's performance and goals. Pt able to follow exercise prescription today without complaint.  Will continue to monitor for progression.   Dr. Emily Filbert is Medical Director for [place] and  [facility].

## 2017-02-20 DIAGNOSIS — Z48812 Encounter for surgical aftercare following surgery on the circulatory system: Secondary | ICD-10-CM | POA: Diagnosis not present

## 2017-02-20 DIAGNOSIS — I214 Non-ST elevation (NSTEMI) myocardial infarction: Secondary | ICD-10-CM

## 2017-02-20 NOTE — Progress Notes (Signed)
Daily Session Note  Patient Details  Name: Alicia Patterson MRN: [id number] Date of Birth: 1930/11/12 Referring Provider:     Cardiac Rehab from 02/16/2017 in [facility] Cardiac and Pulmonary Rehab  Referring Provider  Isaias Cowman MD      Encounter Date: 02/20/2017  Check In: Session Check In - 02/20/17 0841      Check-In   Location  [facility]    Staff Present  Renita Papa, RN Vickki Hearing, BA, ACSM CEP, Exercise Physiologist;Jessica Luan Pulling, MA, ACSM RCEP, Exercise Physiologist    Supervising physician immediately available to respond to emergencies  See telemetry face sheet for immediately available ER MD    Medication changes reported      No    Fall or balance concerns reported     No    Warm-up and Cool-down  Performed on first and last piece of equipment    Resistance Training Performed  Yes    VAD Patient?  No      Pain Assessment   Currently in Pain?  No/denies          Social History   Tobacco Use  Smoking Status Former Smoker  . Last attempt to quit: 1979  . Years since quitting: 39.9  Smokeless Tobacco Never Used    Goals Met:  Independence with exercise equipment Exercise tolerated well No report of cardiac concerns or symptoms Strength training completed today  Goals Unmet:  Not Applicable  Comments: Pt able to follow exercise prescription today without complaint.  Will continue to monitor for progression.    Dr. Emily Filbert is Medical Director for [place] and [facility].

## 2017-02-23 ENCOUNTER — Encounter: Payer: Medicare Other | Admitting: *Deleted

## 2017-02-23 DIAGNOSIS — I214 Non-ST elevation (NSTEMI) myocardial infarction: Secondary | ICD-10-CM

## 2017-02-23 DIAGNOSIS — Z48812 Encounter for surgical aftercare following surgery on the circulatory system: Secondary | ICD-10-CM | POA: Diagnosis not present

## 2017-02-23 NOTE — Progress Notes (Signed)
Daily Session Note  Patient Details  Name: Alicia Patterson MRN: [id number] Date of Birth: 11/02/1930 Referring Provider:     Cardiac Rehab from 02/16/2017 in [facility] Cardiac and Pulmonary Rehab  Referring Provider  Paraschos, Alexander MD      Encounter Date: 02/23/2017  Check In: Session Check In - 02/23/17 0823      Check-In   Location  [facility]    Staff Present  Amanda Sommer, BA, ACSM CEP, Exercise Physiologist; , MA, ACSM RCEP, Exercise Physiologist;Diane Wright RN,BSN    Supervising physician immediately available to respond to emergencies  See telemetry face sheet for immediately available ER MD    Medication changes reported      No    Fall or balance concerns reported     No    Warm-up and Cool-down  Performed on first and last piece of equipment    Resistance Training Performed  Yes    VAD Patient?  No      Pain Assessment   Currently in Pain?  No/denies          Social History   Tobacco Use  Smoking Status Former Smoker  . Last attempt to quit: 1979  . Years since quitting: 40.0  Smokeless Tobacco Never Used    Goals Met:  Independence with exercise equipment Exercise tolerated well No report of cardiac concerns or symptoms Strength training completed today  Goals Unmet:  Not Applicable  Comments: Pt able to follow exercise prescription today without complaint.  Will continue to monitor for progression.    Dr. Mark Miller is Medical Director for [facility] and [facility]. 

## 2017-02-25 ENCOUNTER — Encounter: Payer: Medicare Other | Admitting: *Deleted

## 2017-02-25 DIAGNOSIS — Z48812 Encounter for surgical aftercare following surgery on the circulatory system: Secondary | ICD-10-CM | POA: Diagnosis not present

## 2017-02-25 DIAGNOSIS — I214 Non-ST elevation (NSTEMI) myocardial infarction: Secondary | ICD-10-CM

## 2017-02-25 NOTE — Progress Notes (Signed)
Daily Session Note  Patient Details  Name: Alicia Patterson MRN: [id number] Date of Birth: 03/19/1930 Referring Provider:     Cardiac Rehab from 02/16/2017 in [facility] Cardiac and [facility] Rehab  Referring Provider  Paraschos, Alexander MD      Encounter Date: 02/25/2017  Check In: Session Check In - 02/25/17 0754      Check-In   Location  [facility]    Staff Present  Susanne Bice, RN, BSN, CCRP;Jessica Hawkins, MA, ACSM RCEP, Exercise Physiologist;Other Mandi , BS, CEP     Supervising physician immediately available to respond to emergencies  See telemetry face sheet for immediately available ER MD    Medication changes reported      No    Fall or balance concerns reported     No    Warm-up and Cool-down  Performed on first and last piece of equipment    Resistance Training Performed  Yes    VAD Patient?  No      Pain Assessment   Currently in Pain?  No/denies    Multiple Pain Sites  No          Social History   Tobacco Use  Smoking Status Former Smoker  . Last attempt to quit: 1979  . Years since quitting: 40.0  Smokeless Tobacco Never Used    Goals Met:  Independence with exercise equipment Exercise tolerated well No report of cardiac concerns or symptoms Strength training completed today  Goals Unmet:  Not Applicable  Comments: Pt able to follow exercise prescription today without complaint.  Will continue to monitor for progression.    Dr. Mark Miller is Medical Director for [facility] and [facility]. 

## 2017-02-27 DIAGNOSIS — Z48812 Encounter for surgical aftercare following surgery on the circulatory system: Secondary | ICD-10-CM | POA: Diagnosis not present

## 2017-02-27 DIAGNOSIS — I214 Non-ST elevation (NSTEMI) myocardial infarction: Secondary | ICD-10-CM

## 2017-02-27 NOTE — Progress Notes (Signed)
Daily Session Note  Patient Details  Name: Alicia Patterson MRN: [id number] Date of Birth: [birth year] Referring Provider:     Cardiac Rehab from 02/16/2017 in [facility] Cardiac and Pulmonary Rehab  Referring Provider  Isaias Cowman MD      Encounter Date: 02/27/2017  Check In: Session Check In - 02/27/17 0847      Check-In   Location  [facility]    Staff Present  Renita Papa, RN Vickki Hearing, BA, ACSM CEP, Exercise Physiologist;Jessica Luan Pulling, MA, ACSM RCEP, Exercise Physiologist    Supervising physician immediately available to respond to emergencies  See telemetry face sheet for immediately available ER MD    Medication changes reported      No    Fall or balance concerns reported     No    Warm-up and Cool-down  Performed on first and last piece of equipment    Resistance Training Performed  Yes    VAD Patient?  No      Pain Assessment   Currently in Pain?  No/denies    Multiple Pain Sites  No          Social History   Tobacco Use  Smoking Status Former Smoker  . Last attempt to quit: 1979  . Years since quitting: 40.0  Smokeless Tobacco Never Used    Goals Met:  Independence with exercise equipment Exercise tolerated well No report of cardiac concerns or symptoms Strength training completed today  Goals Unmet:  Not Applicable  Comments: Pt able to follow exercise prescription today without complaint.  Will continue to monitor for progression.    Dr. Emily Filbert is Medical Director for [place] and [facility].

## 2017-03-02 ENCOUNTER — Encounter: Payer: Medicare Other | Admitting: *Deleted

## 2017-03-02 DIAGNOSIS — I214 Non-ST elevation (NSTEMI) myocardial infarction: Secondary | ICD-10-CM

## 2017-03-02 DIAGNOSIS — Z48812 Encounter for surgical aftercare following surgery on the circulatory system: Secondary | ICD-10-CM | POA: Diagnosis not present

## 2017-03-02 NOTE — Progress Notes (Signed)
Daily Session Note  Patient Details  Name: Alicia Patterson MRN: [id number] Date of Birth: March 25, 1930 Referring Provider:     Cardiac Rehab from 02/16/2017 in [facility] Cardiac and Pulmonary Rehab  Referring Provider  Isaias Cowman MD      Encounter Date: 03/02/2017  Check In: Session Check In - 03/02/17 7989      Check-In   Location  [facility]    Staff Present  Renita Papa, RN BSN;Carroll Enterkin, RN, Levie Heritage, MA, ACSM RCEP, Exercise Physiologist    Supervising physician immediately available to respond to emergencies  See telemetry face sheet for immediately available ER MD    Medication changes reported      No    Fall or balance concerns reported     No    Warm-up and Cool-down  Performed on first and last piece of equipment    Resistance Training Performed  Yes    VAD Patient?  No      Pain Assessment   Currently in Pain?  No/denies          Social History   Tobacco Use  Smoking Status Former Smoker  . Last attempt to quit: 1979  . Years since quitting: 40.0  Smokeless Tobacco Never Used    Goals Met:  Independence with exercise equipment Exercise tolerated well No report of cardiac concerns or symptoms Strength training completed today  Goals Unmet:  Not Applicable  Comments: Pt able to follow exercise prescription today without complaint.  Will continue to monitor for progression.    Dr. Emily Filbert is Medical Director for [place] and [facility].

## 2017-03-04 ENCOUNTER — Encounter: Payer: Medicare Other | Attending: Cardiology

## 2017-03-04 ENCOUNTER — Encounter: Payer: Self-pay | Admitting: *Deleted

## 2017-03-04 DIAGNOSIS — Z955 Presence of coronary angioplasty implant and graft: Secondary | ICD-10-CM | POA: Diagnosis not present

## 2017-03-04 DIAGNOSIS — I214 Non-ST elevation (NSTEMI) myocardial infarction: Secondary | ICD-10-CM

## 2017-03-04 DIAGNOSIS — I252 Old myocardial infarction: Secondary | ICD-10-CM | POA: Diagnosis not present

## 2017-03-04 DIAGNOSIS — Z48812 Encounter for surgical aftercare following surgery on the circulatory system: Secondary | ICD-10-CM | POA: Insufficient documentation

## 2017-03-04 NOTE — Progress Notes (Signed)
Daily Session Note  Patient Details  Name: Alicia Patterson MRN: [id number] Date of Birth: [birth year] Referring Provider:     Cardiac Rehab from 02/16/2017 in [facility] Cardiac and Pulmonary Rehab  Referring Provider  Isaias Cowman MD      Encounter Date: 03/04/2017  Check In: Session Check In - 03/04/17 0845      Check-In   Location  [facility]    Staff Present  Justin Mend RCP,RRT,BSRT;Heath Lark, RN, BSN, CCRP;Jessica Luan Pulling, MA, ACSM RCEP, Exercise Physiologist    Supervising physician immediately available to respond to emergencies  See telemetry face sheet for immediately available ER MD    Medication changes reported      No    Fall or balance concerns reported     No    Warm-up and Cool-down  Performed on first and last piece of equipment    Resistance Training Performed  Yes    VAD Patient?  No      Pain Assessment   Currently in Pain?  No/denies          Social History   Tobacco Use  Smoking Status Former Smoker  . Last attempt to quit: 1979  . Years since quitting: 40.0  Smokeless Tobacco Never Used    Goals Met:  Independence with exercise equipment Exercise tolerated well No report of cardiac concerns or symptoms Strength training completed today  Goals Unmet:  Not Applicable  Comments: Reviewed home exercise with pt today.  Pt plans to walk, treadmill, Rec. Bike and use the stepper 2-4 days at home for exercise.  Reviewed THR, pulse, RPE, sign and symptoms, NTG use, and when to call 911 or MD.  Also discussed weather considerations and indoor options.  Pt voiced understanding.Pt able to follow exercise prescription today without complaint.  Will continue to monitor for progression.   Dr. Emily Filbert is Medical Director for [place] and [facility].

## 2017-03-04 NOTE — Progress Notes (Signed)
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 kg)    Goal Weight: Long Term  120 lb (54.4 kg)    Expected Outcomes  Short Term: Continue to assess and modify interventions until short term weight is achieved;Long Term: Adherence to nutrition and physical activity/exercise program aimed toward attainment of established weight goal;Weight Loss: Understanding of general recommendations for a balanced deficit meal plan, which promotes 1-2 lb weight loss per week and includes a negative energy balance of [phone] kcal/d;Understanding recommendations for meals to include 15-35% energy as protein, 25-35% energy from fat, 35-60% energy from carbohydrates, less than 233m of dietary cholesterol, 20-35 gm of total fiber daily;Understanding of distribution of calorie intake throughout the day with the consumption of  4-5 meals/snacks    Lipids  Yes  Intervention  Provide education and support for participant on nutrition & aerobic/resistive exercise along with prescribed medications to achieve LDL <42m, HDL >45m    Expected Outcomes  Short Term: Participant states understanding of desired cholesterol values and is compliant with medications prescribed. Participant is following exercise prescription and nutrition guidelines.;Long Term: Cholesterol controlled with medications as prescribed, with individualized exercise RX and with personalized nutrition plan. Value goals: LDL < 7043mHDL > 40 mg.    Stress  Yes Husban recently passed away (same day she had her NSTEMI). She is trying to figure out his trust and all the financial/emotional differences after losing a spouse.     Intervention  Offer individual and/or small group education and counseling on adjustment to heart disease, stress management and health-related lifestyle change. Teach and support self-help strategies.;Refer participants experiencing significant psychosocial distress to appropriate mental health specialists for further evaluation and treatment. When possible, include family members and significant others in education/counseling sessions.    Expected Outcomes  Short Term: Participant demonstrates changes in health-related behavior, relaxation and other stress management skills, ability to obtain effective social support, and compliance with psychotropic medications if prescribed.;Long Term: Emotional wellbeing is indicated by absence of clinically significant psychosocial distress or social isolation.       Core Components/Risk Factors/Patient Goals Review:    Core Components/Risk Factors/Patient Goals at Discharge (Final Review):    ITP Comments: ITP Comments    Row Name 02/16/17 1305 03/04/17 0627         ITP Comments  Med Review completed. Initial ITP created. Diagnosis can be found in Care Everywhere 12/23/16  30 day review.  Continue with ITP unless directed changes per Medical Director review.          Comments:

## 2017-03-06 DIAGNOSIS — I214 Non-ST elevation (NSTEMI) myocardial infarction: Secondary | ICD-10-CM

## 2017-03-06 DIAGNOSIS — Z48812 Encounter for surgical aftercare following surgery on the circulatory system: Secondary | ICD-10-CM | POA: Diagnosis not present

## 2017-03-06 NOTE — Progress Notes (Signed)
Daily Session Note  Patient Details  Name: Alicia Patterson MRN: [id number] Date of Birth: 02/14/1931 Referring Provider:     Cardiac Rehab from 02/16/2017 in [facility] Cardiac and Pulmonary Rehab  Referring Provider  Paraschos, Alexander MD      Encounter Date: 03/06/2017  Check In: Session Check In - 03/06/17 0928      Check-In   Location  [facility]Cardiac & Pulmonary Rehab    Staff Present  Meredith Craven, RN BSN;Jessica Hawkins, MA, ACSM RCEP, Exercise Physiologist; , BA, ACSM CEP, Exercise Physiologist    Supervising physician immediately available to respond to emergencies  See telemetry face sheet for immediately available ER MD    Medication changes reported      No    Fall or balance concerns reported     No    Warm-up and Cool-down  Performed on first and last piece of equipment    Resistance Training Performed  Yes    VAD Patient?  No      Pain Assessment   Currently in Pain?  No/denies    Multiple Pain Sites  No          Social History   Tobacco Use  Smoking Status Former Smoker  . Last attempt to quit: 1979  . Years since quitting: 40.0  Smokeless Tobacco Never Used    Goals Met:  Independence with exercise equipment Exercise tolerated well No report of cardiac concerns or symptoms Strength training completed today  Goals Unmet:  Not Applicable  Comments: Pt able to follow exercise prescription today without complaint.  Will continue to monitor for progression.    Dr. Mark Miller is Medical Director for [facility] and [facility]. 

## 2017-03-09 ENCOUNTER — Encounter: Payer: Medicare Other | Admitting: *Deleted

## 2017-03-09 DIAGNOSIS — I214 Non-ST elevation (NSTEMI) myocardial infarction: Secondary | ICD-10-CM

## 2017-03-09 DIAGNOSIS — Z48812 Encounter for surgical aftercare following surgery on the circulatory system: Secondary | ICD-10-CM | POA: Diagnosis not present

## 2017-03-09 NOTE — Progress Notes (Signed)
Daily Session Note  Patient Details  Name: Alicia Patterson MRN: [id number] Date of Birth: September 24, 1930 Referring Provider:     Cardiac Rehab from 02/16/2017 in [facility] Cardiac and Pulmonary Rehab  Referring Provider  Isaias Cowman MD      Encounter Date: 03/09/2017  Check In: Session Check In - 03/09/17 0855      Check-In   Location  [facility]    Staff Present  Earlean Shawl, BS, ACSM CEP, Exercise Physiologist;Jessica Luan Pulling, MA, ACSM RCEP, Exercise Physiologist;Krista Frederico Hamman, RN BSN    Supervising physician immediately available to respond to emergencies  See telemetry face sheet for immediately available ER MD    Medication changes reported      No    Fall or balance concerns reported     No    Warm-up and Cool-down  Performed on first and last piece of equipment    Resistance Training Performed  Yes    VAD Patient?  No      Pain Assessment   Currently in Pain?  No/denies    Multiple Pain Sites  No          Social History   Tobacco Use  Smoking Status Former Smoker  . Last attempt to quit: 1979  . Years since quitting: 40.0  Smokeless Tobacco Never Used    Goals Met:  Independence with exercise equipment Exercise tolerated well No report of cardiac concerns or symptoms Strength training completed today  Goals Unmet:  Not Applicable  Comments: Pt able to follow exercise prescription today without complaint.  Will continue to monitor for progression.    Dr. Emily Filbert is Medical Director for [place] and [facility].

## 2017-03-11 DIAGNOSIS — Z48812 Encounter for surgical aftercare following surgery on the circulatory system: Secondary | ICD-10-CM | POA: Diagnosis not present

## 2017-03-11 DIAGNOSIS — I214 Non-ST elevation (NSTEMI) myocardial infarction: Secondary | ICD-10-CM

## 2017-03-11 NOTE — Progress Notes (Signed)
Daily Session Note  Patient Details  Name: ISADORE BOKHARI MRN: [id number] Date of Birth: 08/05/1930 Referring Provider:     Cardiac Rehab from 02/16/2017 in [facility] Cardiac and Pulmonary Rehab  Referring Provider  Isaias Cowman MD      Encounter Date: 03/11/2017  Check In: Session Check In - 03/11/17 0756      Check-In   Location  [facility]    Staff Present  Justin Mend RCP,RRT,BSRT;Heath Lark, RN, BSN, CCRP;Jessica Luan Pulling, MA, ACSM RCEP, Exercise Physiologist    Supervising physician immediately available to respond to emergencies  See telemetry face sheet for immediately available ER MD    Medication changes reported      No    Fall or balance concerns reported     No    Warm-up and Cool-down  Performed on first and last piece of equipment    Resistance Training Performed  Yes    VAD Patient?  No      Pain Assessment   Currently in Pain?  No/denies          Social History   Tobacco Use  Smoking Status Former Smoker  . Last attempt to quit: 1979  . Years since quitting: 40.0  Smokeless Tobacco Never Used    Goals Met:  Independence with exercise equipment Exercise tolerated well No report of cardiac concerns or symptoms Strength training completed today  Goals Unmet:  Not Applicable  Comments: Pt able to follow exercise prescription today without complaint.  Will continue to monitor for progression.   Dr. Emily Filbert is Medical Director for [place] and [facility].

## 2017-03-13 DIAGNOSIS — Z48812 Encounter for surgical aftercare following surgery on the circulatory system: Secondary | ICD-10-CM | POA: Diagnosis not present

## 2017-03-13 DIAGNOSIS — I214 Non-ST elevation (NSTEMI) myocardial infarction: Secondary | ICD-10-CM

## 2017-03-13 NOTE — Progress Notes (Signed)
Daily Session Note  Patient Details  Name: TOBIE PERDUE MRN: [id number] Date of Birth: 1931/01/08 Referring Provider:     Cardiac Rehab from 02/16/2017 in [facility] Cardiac and Pulmonary Rehab  Referring Provider  Isaias Cowman MD      Encounter Date: 03/13/2017  Check In: Session Check In - 03/13/17 0901      Check-In   Location  [facility]    Staff Present  Renita Papa, RN Vickki Hearing, BA, ACSM CEP, Exercise Physiologist;Jessica Luan Pulling, [place], ACSM RCEP, Exercise Physiologist    Supervising physician immediately available to respond to emergencies  See telemetry face sheet for immediately available ER MD    Medication changes reported      No    Fall or balance concerns reported     No    Warm-up and Cool-down  Performed on first and last piece of equipment    Resistance Training Performed  Yes    VAD Patient?  No      Pain Assessment   Currently in Pain?  No/denies    Multiple Pain Sites  No          Social History   Tobacco Use  Smoking Status Former Smoker  . Last attempt to quit: 1979  . Years since quitting: 40.0  Smokeless Tobacco Never Used    Goals Met:  Independence with exercise equipment Exercise tolerated well No report of cardiac concerns or symptoms Strength training completed today  Goals Unmet:  Not Applicable  Comments: Pt able to follow exercise prescription today without complaint.  Will continue to monitor for progression.    Dr. Emily Filbert is Medical Director for [place] and [facility].

## 2017-03-16 ENCOUNTER — Telehealth: Payer: Self-pay | Admitting: Physical Therapy

## 2017-03-16 NOTE — Telephone Encounter (Signed)
Patient called this morning to let us know that she will not be here today due to an eye appointment.

## 2017-03-18 DIAGNOSIS — Z48812 Encounter for surgical aftercare following surgery on the circulatory system: Secondary | ICD-10-CM | POA: Diagnosis not present

## 2017-03-18 DIAGNOSIS — I214 Non-ST elevation (NSTEMI) myocardial infarction: Secondary | ICD-10-CM

## 2017-03-18 NOTE — Progress Notes (Signed)
Daily Session Note  Patient Details  Name: Alicia Patterson MRN: [id number] Date of Birth: 24-Jan-1931 Referring Provider:     Cardiac Rehab from 02/16/2017 in [facility] Cardiac and Pulmonary Rehab  Referring Provider  Isaias Cowman MD      Encounter Date: 03/18/2017  Check In: Session Check In - 03/18/17 0741      Check-In   Location  [facility]    Staff Present  Justin Mend RCP,RRT,BSRT;Krista Frederico Hamman, RN BSN;Jessica Luan Pulling, MA, RCEP, CCRP, Exercise Physiologist    Supervising physician immediately available to respond to emergencies  See telemetry face sheet for immediately available ER MD    Medication changes reported      No    Fall or balance concerns reported     No    Warm-up and Cool-down  Performed on first and last piece of equipment    Resistance Training Performed  Yes    VAD Patient?  No      Pain Assessment   Currently in Pain?  No/denies          Social History   Tobacco Use  Smoking Status Former Smoker  . Last attempt to quit: 1979  . Years since quitting: 40.0  Smokeless Tobacco Never Used    Goals Met:  Independence with exercise equipment Exercise tolerated well Personal goals reviewed No report of cardiac concerns or symptoms Strength training completed today  Goals Unmet:  Not Applicable  Comments: Pt able to follow exercise prescription today without complaint.  Will continue to monitor for progression. See ITP for goal review.   Dr. Emily Filbert is Medical Director for [place] and [facility].

## 2017-03-20 DIAGNOSIS — Z48812 Encounter for surgical aftercare following surgery on the circulatory system: Secondary | ICD-10-CM | POA: Diagnosis not present

## 2017-03-20 DIAGNOSIS — I214 Non-ST elevation (NSTEMI) myocardial infarction: Secondary | ICD-10-CM

## 2017-03-20 NOTE — Progress Notes (Signed)
Daily Session Note  Patient Details  Name: Alicia Patterson MRN: [id number] Date of Birth: 1930-12-18 Referring Provider:     Cardiac Rehab from 02/16/2017 in [facility] Cardiac and Pulmonary Rehab  Referring Provider  Isaias Cowman MD      Encounter Date: 03/20/2017  Check In: Session Check In - 03/20/17 0837      Check-In   Location  [facility]    Staff Present  Alberteen Sam, MA, RCEP, CCRP, Exercise Physiologist;Meredith Sherryll Burger, RN Vickki Hearing, BA, ACSM CEP, Exercise Physiologist    Supervising physician immediately available to respond to emergencies  See telemetry face sheet for immediately available ER MD    Medication changes reported      No    Fall or balance concerns reported     No    Warm-up and Cool-down  Performed on first and last piece of equipment    Resistance Training Performed  Yes    VAD Patient?  No      Pain Assessment   Currently in Pain?  No/denies    Multiple Pain Sites  No          Social History   Tobacco Use  Smoking Status Former Smoker  . Last attempt to quit: 1979  . Years since quitting: 40.0  Smokeless Tobacco Never Used    Goals Met:  Independence with exercise equipment Exercise tolerated well No report of cardiac concerns or symptoms Strength training completed today  Goals Unmet:  Not Applicable  Comments: Pt able to follow exercise prescription today without complaint.  Will continue to monitor for progression.    Dr. Emily Filbert is Medical Director for [place] and [facility].

## 2017-03-23 ENCOUNTER — Encounter: Payer: Medicare Other | Admitting: *Deleted

## 2017-03-23 DIAGNOSIS — Z48812 Encounter for surgical aftercare following surgery on the circulatory system: Secondary | ICD-10-CM | POA: Diagnosis not present

## 2017-03-23 DIAGNOSIS — I214 Non-ST elevation (NSTEMI) myocardial infarction: Secondary | ICD-10-CM

## 2017-03-23 NOTE — Progress Notes (Signed)
Daily Session Note  Patient Details  Name: Alicia Patterson MRN: [id number] Date of Birth: 19-Sep-1930 Referring Provider:     Cardiac Rehab from 02/16/2017 in [facility] Cardiac and Pulmonary Rehab  Referring Provider  Isaias Cowman MD      Encounter Date: 03/23/2017  Check In: Session Check In - 03/23/17 0746      Check-In   Location  [facility]    Staff Present  Alberteen Sam, MA, RCEP, CCRP, Exercise Physiologist;Kelly Amedeo Plenty, BS, ACSM CEP, Exercise Physiologist;Meredith Sherryll Burger, RN BSN    Supervising physician immediately available to respond to emergencies  See telemetry face sheet for immediately available ER MD    Medication changes reported      No    Fall or balance concerns reported     No    Warm-up and Cool-down  Performed on first and last piece of equipment    Resistance Training Performed  Yes    VAD Patient?  No      Pain Assessment   Currently in Pain?  No/denies          Social History   Tobacco Use  Smoking Status Former Smoker  . Last attempt to quit: 1979  . Years since quitting: 40.0  Smokeless Tobacco Never Used    Goals Met:  Independence with exercise equipment Exercise tolerated well No report of cardiac concerns or symptoms Strength training completed today  Goals Unmet:  Not Applicable  Comments: Pt able to follow exercise prescription today without complaint.  Will continue to monitor for progression.    Dr. Emily Filbert is Medical Director for [place] and [facility].

## 2017-03-25 DIAGNOSIS — I214 Non-ST elevation (NSTEMI) myocardial infarction: Secondary | ICD-10-CM

## 2017-03-25 DIAGNOSIS — Z48812 Encounter for surgical aftercare following surgery on the circulatory system: Secondary | ICD-10-CM | POA: Diagnosis not present

## 2017-03-25 NOTE — Progress Notes (Signed)
Daily Session Note  Patient Details  Name: Alicia Patterson MRN: [id number] Date of Birth: Jul 06, 1930 Referring Provider:     Cardiac Rehab from 02/16/2017 in [facility] Cardiac and Pulmonary Rehab  Referring Provider  Isaias Cowman MD      Encounter Date: 03/25/2017  Check In: Session Check In - 03/25/17 0742      Check-In   Location  [facility]    Staff Present  Justin Mend RCP,RRT,BSRT;Carroll Enterkin, RN, Levie Heritage, MA, RCEP, CCRP, Exercise Physiologist    Supervising physician immediately available to respond to emergencies  See telemetry face sheet for immediately available ER MD    Medication changes reported      No    Fall or balance concerns reported     No    Warm-up and Cool-down  Performed on first and last piece of equipment    Resistance Training Performed  Yes    VAD Patient?  No      Pain Assessment   Currently in Pain?  No/denies          Social History   Tobacco Use  Smoking Status Former Smoker  . Last attempt to quit: 1979  . Years since quitting: 40.0  Smokeless Tobacco Never Used    Goals Met:  Independence with exercise equipment Exercise tolerated well No report of cardiac concerns or symptoms Strength training completed today  Goals Unmet:  Not Applicable  Comments: Pt able to follow exercise prescription today without complaint.  Will continue to monitor for progression.   Dr. Emily Filbert is Medical Director for [place] and [facility].

## 2017-03-27 DIAGNOSIS — Z48812 Encounter for surgical aftercare following surgery on the circulatory system: Secondary | ICD-10-CM | POA: Diagnosis not present

## 2017-03-27 DIAGNOSIS — I214 Non-ST elevation (NSTEMI) myocardial infarction: Secondary | ICD-10-CM

## 2017-03-27 NOTE — Progress Notes (Signed)
Daily Session Note  Patient Details  Name: Alicia Patterson MRN: [id number] Date of Birth: 07-Oct-1930 Referring Provider:     Cardiac Rehab from 02/16/2017 in [facility] Cardiac and Pulmonary Rehab  Referring Provider  Isaias Cowman MD      Encounter Date: 03/27/2017  Check In: Session Check In - 03/27/17 0851      Check-In   Location  [facility]    Staff Present  Joellyn Rued, BS, [place];Nada Maclachlan, BA, ACSM CEP, Exercise Physiologist;Susanne Bice, RN, BSN, CCRP    Supervising physician immediately available to respond to emergencies  See telemetry face sheet for immediately available ER MD    Medication changes reported      No    Fall or balance concerns reported     No    Tobacco Cessation  No Change    Warm-up and Cool-down  Performed on first and last piece of equipment    Resistance Training Performed  Yes    VAD Patient?  No      Pain Assessment   Currently in Pain?  No/denies    Multiple Pain Sites  No          Social History   Tobacco Use  Smoking Status Former Smoker  . Last attempt to quit: 1979  . Years since quitting: 40.0  Smokeless Tobacco Never Used    Goals Met:  Independence with exercise equipment Exercise tolerated well No report of cardiac concerns or symptoms Strength training completed today  Goals Unmet:  Not Applicable  Comments: Pt able to follow exercise prescription today without complaint.  Will continue to monitor for progression.    Dr. Emily Filbert is Medical Director for [place] and [facility].

## 2017-03-30 ENCOUNTER — Encounter: Payer: Medicare Other | Admitting: *Deleted

## 2017-03-30 DIAGNOSIS — Z48812 Encounter for surgical aftercare following surgery on the circulatory system: Secondary | ICD-10-CM | POA: Diagnosis not present

## 2017-03-30 DIAGNOSIS — I214 Non-ST elevation (NSTEMI) myocardial infarction: Secondary | ICD-10-CM

## 2017-03-30 NOTE — Progress Notes (Signed)
Daily Session Note  Patient Details  Name: Alicia Patterson MRN: [id number] Date of Birth: [birth year] Referring Provider:     Cardiac Rehab from 02/16/2017 in [facility] Cardiac and Pulmonary Rehab  Referring Provider  Isaias Cowman MD      Encounter Date: 03/30/2017  Check In: Session Check In - 03/30/17 0735      Check-In   Location  [facility]    Staff Present  Alberteen Sam, MA, RCEP, CCRP, Exercise Physiologist;Kelly Amedeo Plenty, BS, ACSM CEP, Exercise Physiologist;Krista Frederico Hamman, RN BSN    Supervising physician immediately available to respond to emergencies  See telemetry face sheet for immediately available ER MD    Medication changes reported      No    Fall or balance concerns reported     No    Tobacco Cessation  No Change    Warm-up and Cool-down  Performed on first and last piece of equipment    Resistance Training Performed  Yes    VAD Patient?  No      Pain Assessment   Currently in Pain?  No/denies    Multiple Pain Sites  No          Social History   Tobacco Use  Smoking Status Former Smoker  . Last attempt to quit: 1979  . Years since quitting: 40.1  Smokeless Tobacco Never Used    Goals Met:  Independence with exercise equipment Exercise tolerated well No report of cardiac concerns or symptoms Strength training completed today  Goals Unmet:  Not Applicable  Comments: Pt able to follow exercise prescription today without complaint.  Will continue to monitor for progression.    Dr. Emily Filbert is Medical Director for [place] and [facility].

## 2017-04-01 ENCOUNTER — Encounter: Payer: Self-pay | Admitting: *Deleted

## 2017-04-01 VITALS — Ht 62.9 in | Wt 136.0 lb

## 2017-04-01 DIAGNOSIS — Z48812 Encounter for surgical aftercare following surgery on the circulatory system: Secondary | ICD-10-CM | POA: Diagnosis not present

## 2017-04-01 DIAGNOSIS — I214 Non-ST elevation (NSTEMI) myocardial infarction: Secondary | ICD-10-CM

## 2017-04-01 NOTE — Progress Notes (Signed)
Daily Session Note  Patient Details  Name: Alicia Patterson MRN: [id number] Date of Birth: 1930-10-01 Referring Provider:     Cardiac Rehab from 02/16/2017 in [facility] Cardiac and Pulmonary Rehab  Referring Provider  Isaias Cowman MD      Encounter Date: 04/01/2017  Check In: Session Check In - 04/01/17 0730      Check-In   Location  [facility]    Staff Present  Justin Mend RCP,RRT,BSRT;Heath Lark, RN, BSN, CCRP;Jessica Luan Pulling, MA, RCEP, CCRP, Exercise Physiologist    Supervising physician immediately available to respond to emergencies  See telemetry face sheet for immediately available ER MD    Medication changes reported      No    Fall or balance concerns reported     No    Warm-up and Cool-down  Performed on first and last piece of equipment    Resistance Training Performed  Yes    VAD Patient?  No      Pain Assessment   Currently in Pain?  No/denies          Social History   Tobacco Use  Smoking Status Former Smoker  . Last attempt to quit: 1979  . Years since quitting: 40.1  Smokeless Tobacco Never Used    Goals Met:  Independence with exercise equipment Exercise tolerated well No report of cardiac concerns or symptoms Strength training completed today  Goals Unmet:  Not Applicable  Comments: Pt able to follow exercise prescription today without complaint.  Will continue to monitor for progression. [place] Name 02/16/17 1553 04/01/17 0908       6 Minute Walk   Phase  Initial  Discharge    Distance  1400 feet  1685 feet    Distance % Change  -  20.4 %    Distance Feet Change  -  285 ft    Walk Time  6 minutes  6 minutes    # of Rest Breaks  0  0    MPH  2.65  3.19    METS  1.71  3.02    RPE  11  13    VO2 Peak  5.97  10.57    Symptoms  No  No    Resting HR  60 bpm  62 bpm    Resting BP  104/56  116/52    Resting Oxygen Saturation   99 %  -    Exercise Oxygen Saturation  during 6 min walk  97 %  -    Max  Ex. HR  112 bpm  96 bpm    Max Ex. BP  146/74  154/74    2 Minute Post BP  124/60  -        Dr. Emily Filbert is Medical Director for [place] and [facility].

## 2017-04-01 NOTE — Progress Notes (Signed)
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 vision.  She needs new glasses.  It has really hit her hard.  She feels like crying quite often.  She is also not sleeping well and wakes up around 2-3am everyday and is not able to get back to sleep.  She  is still holding it in from when her husband passed.  She considers herself to be a stong person.n She hates having to rely on her daughter so much for help with driving and taking care of things around the house.  She has been getting help to get rid of his clothes.  She wants to get out to help others to make her feel better.     Expected Outcomes  Short: Take time to herself and let things go.  Long: Continue to work on [employer] and accept help.     Interventions  Stress management education;Encouraged to attend Cardiac Rehabilitation for the exercise    Continue Psychosocial Services   Follow up required by staff    Comments  Husband recently passed away unexpectedly (on the same day she had her NSTEMI). She is trying to figure out his trust and all the financial issues with taxes  coming up. 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 She wants to be as independent as possible so is trying to get stronger      Initial Review   Source of Stress Concerns  Financial;Family;Unable to perform yard/household activities       Vocational Rehabilitation: Provide vocational rehab assistance to qualifying candidates.   Vocational Rehab Evaluation & Intervention: Vocational Rehab - 02/16/17 1409      Initial Vocational Rehab Evaluation & Intervention   Assessment shows need for Vocational Rehabilitation  No       Education: Education Goals: Education classes will be provided on a variety of topics geared toward better understanding of heart health and risk factor modification. Participant will state understanding/return demonstration of topics presented as noted by education test scores.  Learning Barriers/Preferences: Learning Barriers/Preferences - 02/16/17 1408      Learning Barriers/Preferences   Learning Barriers  Sight    Learning Preferences  Individual Instruction       Education Topics:  AED/CPR: - Group verbal and written instruction with the use of models to demonstrate the basic use of the AED with the basic ABC's of resuscitation.   General Nutrition Guidelines/Fats and Fiber: -Group instruction provided by verbal, written material, models and posters to present the general guidelines for heart healthy nutrition. Gives an explanation and review of dietary fats and fiber.   Cardiac Rehab from 03/30/2017 in [facility] Cardiac and Pulmonary Rehab  Date  03/30/17  Educator  CR  Instruction Review Code  1- Verbalizes Understanding      Controlling Sodium/Reading Food Labels: -Group verbal and written material supporting the discussion of sodium use in heart healthy nutrition. Review and explanation with models, verbal and written materials for utilization of the food label.   Exercise Physiology & General Exercise Guidelines: - Group  verbal and written instruction with models to review the exercise physiology of the cardiovascular system and associated critical values. Provides general exercise guidelines with specific guidelines to those with heart or lung disease.    Cardiac Rehab from 03/30/2017 in [facility] Cardiac and Pulmonary Rehab  Date  02/25/17  Educator  [facility]  Instruction Review Code  1- Verbalizes Understanding      Aerobic Exercise & Resistance Training: - Gives group verbal and written instruction on the various components of exercise. Focuses on aerobic and resistive training programs and the benefits of this training and how to safely progress through these programs..   Cardiac Rehab from 03/30/2017 in [facility] Cardiac and Pulmonary Rehab  Date  03/04/17  Educator  [facility]  Instruction Review Code  1- Verbalizes Understanding      Flexibility, Balance, Mind/Body Relaxation: Provides group verbal/written instruction on the benefits of flexibility and balance training, including mind/body exercise modes such as yoga, pilates and tai chi.  Demonstration and skill practice provided.   Cardiac Rehab from 03/30/2017 in [facility] Cardiac and Pulmonary Rehab  Date  03/09/17  Educator  [facility]  Instruction Review Code  1- Verbalizes Understanding      Stress and Anxiety: - Provides group verbal and written instruction about the health risks of elevated stress and causes of high stress.  Discuss the correlation between heart/lung disease and anxiety and treatment options. Review healthy ways to manage with stress and anxiety.   Cardiac Rehab from 03/30/2017 in [facility] Cardiac and Pulmonary Rehab  Date  03/25/17  Educator  [facility]  Instruction Review Code  1- Verbalizes Understanding      Depression: - Provides group verbal and written instruction on the correlation between heart/lung disease and depressed mood, treatment options, and the stigmas associated with seeking treatment.   Anatomy & Physiology of the Heart: - Group verbal and  written instruction and models provide basic cardiac anatomy and physiology, with the coronary electrical and arterial systems. Review of Valvular disease and Heart Failure   Cardiac Rehab from 03/30/2017 in [facility] Cardiac and Pulmonary Rehab  Date  03/23/17  Educator  [facility]  Instruction Review Code  1- Verbalizes Understanding      Cardiac Procedures: - Group verbal and written instruction to review commonly prescribed medications for heart disease. Reviews the medication, class of the drug, and side effects. Includes the steps to properly store meds and maintain the prescription regimen. (beta blockers and nitrates)   Cardiac Medications I: - Group verbal and written instruction to review commonly prescribed medications for heart disease. Reviews the medication, class of the drug, and side effects. Includes the steps to properly store meds and maintain the prescription regimen.   Cardiac Medications II: -Group verbal and written instruction to review commonly prescribed medications for heart disease. Reviews the medication, class of the drug, and side effects. (all other drug classes)    Go Sex-Intimacy & Heart Disease, Get SMART - Goal Setting: - Group verbal and written instruction through game format to discuss heart disease and the return to sexual intimacy. Provides group verbal and written material to discuss and apply goal setting through the application  of the S.M.A.R.T. Method.   Other Matters of the Heart: - Provides group verbal, written materials and models to describe Stable Angina and Peripheral Artery. Includes description of the disease process and treatment options available to the cardiac patient.   Exercise & Equipment Safety: - Individual verbal instruction and demonstration of equipment use and safety with use of the equipment.   Cardiac Rehab from 03/30/2017 in [facility] Cardiac and Pulmonary Rehab  Date  02/16/17  Educator  [facility]  Instruction Review Code  1- Verbalizes  Understanding      Infection Prevention: - Provides verbal and written material to individual with discussion of infection control including proper hand washing and proper equipment cleaning during exercise session.   Cardiac Rehab from 03/30/2017 in [facility] Cardiac and Pulmonary Rehab  Date  02/16/17  Educator  [facility]  Instruction Review Code  1- Verbalizes Understanding      Falls Prevention: - Provides verbal and written material to individual with discussion of falls prevention and safety.   Cardiac Rehab from 03/30/2017 in [facility] Cardiac and Pulmonary Rehab  Date  02/16/17  Educator  [facility]  Instruction Review Code  1- Verbalizes Understanding      Diabetes: - Individual verbal and written instruction to review signs/symptoms of diabetes, desired ranges of glucose level fasting, after meals and with exercise. Acknowledge that pre and post exercise glucose checks will be done for 3 sessions at entry of program.   Know Your Numbers and Risk Factors: -Group verbal and written instruction about important numbers in your health.  Discussion of what are risk factors and how they play a role in the disease process.  Review of Cholesterol, Blood Pressure, Diabetes, and BMI and the role they play in your overall health.   Sleep Hygiene: -Provides group verbal and written instruction about how sleep can affect your health.  Define sleep hygiene, discuss sleep cycles and impact of sleep habits. Review good sleep hygiene tips.    Other: -Provides group and verbal instruction on various topics (see comments)   Cardiac Rehab from 03/30/2017 in [facility] Cardiac and Pulmonary Rehab  Date  03/11/17  Educator  [facility]  Instruction Review Code  1- Verbalizes Understanding [Risk Factors and Know Your Numbers]      Knowledge Questionnaire Score: Knowledge Questionnaire Score - 02/16/17 1408      Knowledge Questionnaire Score   Pre Score  22/28 Correct answers reviewed with patient       Core  Components/Risk Factors/Patient Goals at Admission: Personal Goals and Risk Factors at Admission - 02/16/17 1358      Core Components/Risk Factors/Patient Goals on Admission    Weight Management  Yes;Weight Loss    Intervention  Weight Management: Develop a combined nutrition and exercise program designed to reach desired caloric intake, while maintaining appropriate intake of nutrient and fiber, sodium and fats, and appropriate energy expenditure required for the weight goal.;Weight Management: Provide education and appropriate resources to help participant work on and attain dietary goals.;Weight Management/Obesity: Establish reasonable short term and long term weight goals.    Admit Weight  129 lb (58.5 kg) weighs at home every morning and keeps log    Goal Weight: Short Term  125 lb (56.7 kg)    Goal Weight: Long Term  120 lb (54.4 kg)    Expected Outcomes  Short Term: Continue to assess and modify interventions until short term weight is achieved;Long Term: Adherence to nutrition and physical activity/exercise program aimed toward attainment of established weight goal;Weight Loss:  Understanding of general recommendations for a balanced deficit meal plan, which promotes 1-2 lb weight loss per week and includes a negative energy balance of [phone] kcal/d;Understanding recommendations for meals to include 15-35% energy as protein, 25-35% energy from fat, 35-60% energy from carbohydrates, less than '200mg'$  of dietary cholesterol, 20-35 gm of total fiber daily;Understanding of distribution of calorie intake throughout the day with the consumption of 4-5 meals/snacks    Lipids  Yes    Intervention  Provide education and support for participant on nutrition & aerobic/resistive exercise along with prescribed medications to achieve LDL '70mg'$ , HDL >'40mg'$ .    Expected Outcomes  Short Term: Participant states understanding of desired cholesterol values and is compliant with medications prescribed. Participant is  following exercise prescription and nutrition guidelines.;Long Term: Cholesterol controlled with medications as prescribed, with individualized exercise RX and with personalized nutrition plan. Value goals: LDL < '70mg'$ , HDL > 40 mg.    Stress  Yes Husban recently passed away (same day she had her NSTEMI). She is trying to figure out his trust and all the financial/emotional differences after losing a spouse.     Intervention  Offer individual and/or small group education and counseling on adjustment to heart disease, stress management and health-related lifestyle change. Teach and support self-help strategies.;Refer participants experiencing significant psychosocial distress to appropriate mental health specialists for further evaluation and treatment. When possible, include family members and significant others in education/counseling sessions.    Expected Outcomes  Short Term: Participant demonstrates changes in health-related behavior, relaxation and other stress management skills, ability to obtain effective social support, and compliance with psychotropic medications if prescribed.;Long Term: Emotional wellbeing is indicated by absence of clinically significant psychosocial distress or social isolation.       Core Components/Risk Factors/Patient Goals Review:  Goals and Risk Factor Review    Row Name 03/18/17 0840             Core Components/Risk Factors/Patient Goals Review   Personal Goals Review  Weight Management/Obesity;Hypertension;Lipids       Review  Alicia Patterson has been doing well in rehab.  Her blood pressures are high in the morning; she takes her meds with breakfast and blood pressures are better by the time she gets to rehab.  Her weight has been good and watching her sodium intake closely.        Expected Outcomes  Short: Meet with nutrition to discuss weight loss.  Long: Continue to work on risk factor modifications.           Core Components/Risk Factors/Patient Goals at  Discharge (Final Review):  Goals and Risk Factor Review - 03/18/17 0840      Core Components/Risk Factors/Patient Goals Review   Personal Goals Review  Weight Management/Obesity;Hypertension;Lipids    Review  Alicia Patterson has been doing well in rehab.  Her blood pressures are high in the morning; she takes her meds with breakfast and blood pressures are better by the time she gets to rehab.  Her weight has been good and watching her sodium intake closely.     Expected Outcomes  Short: Meet with nutrition to discuss weight loss.  Long: Continue to work on risk factor modifications.        ITP Comments: ITP Comments    Row Name 02/16/17 1305 03/04/17 0627 04/01/17 0553       ITP Comments  Med Review completed. Initial ITP created. Diagnosis can be found in Care Everywhere 12/23/16  30 day review. Continue with ITP unless directed changes  per Medical Director review.   30 Day review. Continue with ITP unless directed changes per Medical Director review.         Comments:

## 2017-04-03 ENCOUNTER — Encounter: Payer: Medicare Other | Attending: Cardiology | Admitting: *Deleted

## 2017-04-03 DIAGNOSIS — I214 Non-ST elevation (NSTEMI) myocardial infarction: Secondary | ICD-10-CM

## 2017-04-03 DIAGNOSIS — I252 Old myocardial infarction: Secondary | ICD-10-CM | POA: Insufficient documentation

## 2017-04-03 DIAGNOSIS — Z48812 Encounter for surgical aftercare following surgery on the circulatory system: Secondary | ICD-10-CM | POA: Diagnosis present

## 2017-04-03 DIAGNOSIS — Z955 Presence of coronary angioplasty implant and graft: Secondary | ICD-10-CM | POA: Diagnosis not present

## 2017-04-03 NOTE — Progress Notes (Signed)
Daily Session Note  Patient Details  Name: Alicia Patterson MRN: [id number] Date of Birth: 02-13-1931 Referring Provider:     Cardiac Rehab from 02/16/2017 in [facility] Cardiac and Pulmonary Rehab  Referring Provider  Isaias Cowman MD      Encounter Date: 04/03/2017  Check In: Session Check In - 04/03/17 0925      Check-In   Location  [facility]-Cardiac & Pulmonary Rehab    Staff Present  Alberteen Sam, MA, RCEP, CCRP, Exercise Physiologist;Meredith Sherryll Burger, RN Vickki Hearing, BA, ACSM CEP, Exercise Physiologist    Supervising physician immediately available to respond to emergencies  See telemetry face sheet for immediately available ER MD    Medication changes reported      No    Fall or balance concerns reported     No    Warm-up and Cool-down  Performed on first and last piece of equipment    Resistance Training Performed  Yes      Pain Assessment   Currently in Pain?  No/denies          Social History   Tobacco Use  Smoking Status Former Smoker  . Last attempt to quit: 1979  . Years since quitting: 40.1  Smokeless Tobacco Never Used    Goals Met:  Independence with exercise equipment Exercise tolerated well No report of cardiac concerns or symptoms Strength training completed today  Goals Unmet:  Not Applicable  Comments: Pt able to follow exercise prescription today without complaint.  Will continue to monitor for progression.    Dr. Emily Filbert is Medical Director for [place] and [facility].

## 2017-04-06 ENCOUNTER — Encounter: Payer: Medicare Other | Admitting: *Deleted

## 2017-04-06 DIAGNOSIS — I214 Non-ST elevation (NSTEMI) myocardial infarction: Secondary | ICD-10-CM

## 2017-04-06 DIAGNOSIS — Z48812 Encounter for surgical aftercare following surgery on the circulatory system: Secondary | ICD-10-CM | POA: Diagnosis not present

## 2017-04-06 NOTE — Progress Notes (Signed)
Daily Session Note  Patient Details  Name: AMERIS AKAMINE MRN: [id number] Date of Birth: 04/14/1930 Referring Provider:     Cardiac Rehab from 02/16/2017 in [facility] Cardiac and Pulmonary Rehab  Referring Provider  Isaias Cowman MD      Encounter Date: 04/06/2017  Check In: Session Check In - 04/06/17 0808      Check-In   Location  [facility]-Cardiac & Pulmonary Rehab    Staff Present  Alberteen Sam, MA, RCEP, CCRP, Exercise Physiologist;Kelly Amedeo Plenty, BS, ACSM CEP, Exercise Physiologist;Susanne Bice, RN, BSN, CCRP    Supervising physician immediately available to respond to emergencies  See telemetry face sheet for immediately available ER MD    Medication changes reported      No    Fall or balance concerns reported     No    Warm-up and Cool-down  Performed on first and last piece of equipment    Resistance Training Performed  Yes    VAD Patient?  No      Pain Assessment   Currently in Pain?  No/denies    Multiple Pain Sites  No          Social History   Tobacco Use  Smoking Status Former Smoker  . Last attempt to quit: 1979  . Years since quitting: 40.1  Smokeless Tobacco Never Used    Goals Met:  Independence with exercise equipment Personal goals reviewed No report of cardiac concerns or symptoms Strength training completed today  Goals Unmet:  Not Applicable  Comments: Pt able to follow exercise prescription today without complaint.  Will continue to monitor for progression.    Dr. Emily Filbert is Medical Director for [place] and [facility].

## 2017-04-08 DIAGNOSIS — Z48812 Encounter for surgical aftercare following surgery on the circulatory system: Secondary | ICD-10-CM | POA: Diagnosis not present

## 2017-04-08 DIAGNOSIS — I214 Non-ST elevation (NSTEMI) myocardial infarction: Secondary | ICD-10-CM

## 2017-04-08 NOTE — Progress Notes (Signed)
Daily Session Note  Patient Details  Name: Alicia Patterson MRN: [id number] Date of Birth: 1930-05-17 Referring Provider:     Cardiac Rehab from 02/16/2017 in [facility] Cardiac and Pulmonary Rehab  Referring Provider  Isaias Cowman MD      Encounter Date: 04/08/2017  Check In: Session Check In - 04/08/17 0743      Check-In   Location  [facility]-Cardiac & Pulmonary Rehab    Staff Present  Justin Mend RCP,RRT,BSRT;Heath Lark, RN, BSN, CCRP;Jessica Luan Pulling, MA, RCEP, CCRP, Exercise Physiologist    Supervising physician immediately available to respond to emergencies  See telemetry face sheet for immediately available ER MD    Medication changes reported      No    Fall or balance concerns reported     No    Warm-up and Cool-down  Performed on first and last piece of equipment    Resistance Training Performed  Yes    VAD Patient?  No      Pain Assessment   Currently in Pain?  No/denies          Social History   Tobacco Use  Smoking Status Former Smoker  . Last attempt to quit: 1979  . Years since quitting: 40.1  Smokeless Tobacco Never Used    Goals Met:  Independence with exercise equipment Exercise tolerated well No report of cardiac concerns or symptoms Strength training completed today  Goals Unmet:  Not Applicable  Comments: Pt able to follow exercise prescription today without complaint.  Will continue to monitor for progression.   Dr. Emily Filbert is Medical Director for [place] and [facility].

## 2017-04-10 DIAGNOSIS — I214 Non-ST elevation (NSTEMI) myocardial infarction: Secondary | ICD-10-CM

## 2017-04-10 DIAGNOSIS — Z48812 Encounter for surgical aftercare following surgery on the circulatory system: Secondary | ICD-10-CM | POA: Diagnosis not present

## 2017-04-10 NOTE — Progress Notes (Signed)
Daily Session Note  Patient Details  Name: Alicia Patterson MRN: [id number] Date of Birth: 05-Aug-1930 Referring Provider:     Cardiac Rehab from 02/16/2017 in [facility] Cardiac and Pulmonary Rehab  Referring Provider  Isaias Cowman MD      Encounter Date: 04/10/2017  Check In: Session Check In - 04/10/17 0849      Check-In   Location  [facility]-Cardiac & Pulmonary Rehab    Staff Present  Alberteen Sam, MA, RCEP, CCRP, Exercise Physiologist;Meredith Sherryll Burger, RN Vickki Hearing, BA, ACSM CEP, Exercise Physiologist    Supervising physician immediately available to respond to emergencies  See telemetry face sheet for immediately available ER MD    Medication changes reported      No    Fall or balance concerns reported     No    Warm-up and Cool-down  Performed on first and last piece of equipment    Resistance Training Performed  Yes    VAD Patient?  No      Pain Assessment   Currently in Pain?  No/denies    Multiple Pain Sites  No          Social History   Tobacco Use  Smoking Status Former Smoker  . Last attempt to quit: 1979  . Years since quitting: 40.1  Smokeless Tobacco Never Used    Goals Met:  Independence with exercise equipment Exercise tolerated well No report of cardiac concerns or symptoms Strength training completed today  Goals Unmet:  Not Applicable  Comments: Pt able to follow exercise prescription today without complaint.  Will continue to monitor for progression.    Dr. Emily Filbert is Medical Director for [place] and [facility].

## 2017-04-13 ENCOUNTER — Encounter: Payer: Medicare Other | Admitting: *Deleted

## 2017-04-13 DIAGNOSIS — I214 Non-ST elevation (NSTEMI) myocardial infarction: Secondary | ICD-10-CM

## 2017-04-13 DIAGNOSIS — Z48812 Encounter for surgical aftercare following surgery on the circulatory system: Secondary | ICD-10-CM | POA: Diagnosis not present

## 2017-04-13 NOTE — Progress Notes (Signed)
Discharge Progress Report  Patient Details  Name: Alicia Patterson MRN: [id number] Date of Birth: August 22, 1930 Referring Provider:     Cardiac Rehab from 02/16/2017 in [facility]iac [facility]  Referring Provider  Isaias Cowman MD       Number of Visits: 36/36  Reason for Discharge:  Patient reached a stable level of exercise. Patient independent in their exercise. Patient has met program and personal goals.  Smoking History:  Social History   Tobacco Use  Smoking Status Former Smoker  . Last attempt to quit: 1979  . Years since quitting: 40.1  Smokeless Tobacco Never Used    Diagnosis:  NSTEMI (non-ST elevated myocardial infarction) ([place])  ADL [facility]:   Initial Exercise Prescription: Initial Exercise Prescription - 02/16/17 1500      Date of Initial Exercise RX and Referring Provider   Date  02/16/17    Referring Provider  Isaias Cowman MD      Treadmill   MPH  2    Grade  0.5    Minutes  15    METs  2.67      Recumbant Bike   Level  1    RPM  50    Minutes  15    METs  1.7      NuStep   Level  1    SPM  80    Minutes  15    METs  1.7      Prescription Details   Frequency (times per week)  3    Duration  Progress to 45 minutes of aerobic exercise without signs/symptoms of physical distress      Intensity   THRR 40-80% of Max Heartrate  90-121    Ratings of Perceived Exertion  11-13    Perceived Dyspnea  0-4      Progression   Progression  Continue to progress workloads to maintain intensity without signs/symptoms of physical distress.      Resistance Training   Training Prescription  Yes    Weight  3 lbs    Reps  10-15       Discharge Exercise Prescription (Final Exercise Prescription Changes): Exercise Prescription Changes - 04/08/17 1400      Response to Exercise   Blood Pressure (Admit)  134/70    Blood Pressure (Exercise)  138/64    Blood Pressure (Exit)  118/60    Heart Rate (Admit)  70 bpm    Heart Rate  (Exercise)  98 bpm    Heart Rate (Exit)  63 bpm    Rating of Perceived Exertion (Exercise)  12    Symptoms  none    Duration  Continue with 45 min of aerobic exercise without signs/symptoms of physical distress.    Intensity  THRR unchanged      Progression   Progression  Continue to progress workloads to maintain intensity without signs/symptoms of physical distress.    Average METs  3.3      Resistance Training   Training Prescription  Yes    Weight  3 lbs    Reps  10-15      Interval Training   Interval Training  No      Treadmill   MPH  2.9    Grade  0.5    Minutes  15    METs  3.42      Recumbant Bike   Level  3    Watts  29    Minutes  15  METs  3.57      NuStep   Level  4    Minutes  15    METs  3.1      Home Exercise Plan   Plans to continue exercise at  Home (comment)    Frequency  Add 2 additional days to program exercise sessions. 2-4 days    Initial Home Exercises Provided  03/04/17       Functional Capacity: 6 Minute Walk    Row Name 02/16/17 1553 04/01/17 0908       6 Minute Walk   Phase  Initial  Discharge    Distance  1400 feet  1685 feet    Distance % Change  -  20.4 %    Distance Feet Change  -  285 ft    Walk Time  6 minutes  6 minutes    # of Rest Breaks  0  0    MPH  2.65  3.19    METS  1.71  3.02    RPE  11  13    VO2 Peak  5.97  10.57    Symptoms  No  No    Resting HR  60 bpm  62 bpm    Resting BP  104/56  116/52    Resting Oxygen Saturation   99 %  -    Exercise Oxygen Saturation  during 6 min walk  97 %  -    Max Ex. HR  112 bpm  96 bpm    Max Ex. BP  146/74  154/74    2 Minute Post BP  124/60  -       Psychological, QOL, Others - Outcomes: PHQ 2/9: Depression screen [facility] 2/9 04/08/2017 02/16/2017  Decreased Interest 0 1  Down, Depressed, Hopeless 0 1  PHQ - 2 Score 0 2  Altered sleeping 1 0  Tired, decreased energy 1 0  Change in appetite 0 0  Feeling bad or failure about yourself  0 0  Trouble concentrating 1 0   Moving slowly or fidgety/restless 0 0  Suicidal thoughts 0 0  PHQ-9 Score 3 2  Difficult doing work/chores Not difficult at all Not difficult at all    Quality of Life: Quality of Life - 04/08/17 0834      Quality of Life Scores   Health/Function Pre  25.29 %    Health/Function Post  26.18 %    Health/Function % Change  3.52 %    Socioeconomic Pre  27 %    Socioeconomic Post  27.07 %    Socioeconomic % Change   0.26 %    Psych/Spiritual Pre  28.29 %    Psych/Spiritual Post  25.71 %    Psych/Spiritual % Change  -9.12 %    Family Pre  28.8 %    Family Post  25.5 %    Family % Change  -11.46 %    GLOBAL Pre  26.71 %    GLOBAL Post  26.19 %    GLOBAL % Change  -1.95 %       Personal Goals: Goals established at orientation with interventions provided to work toward goal. Personal Goals and Risk Factors at Admission - 02/16/17 1358      Core Components/Risk Factors/Patient Goals on Admission    Weight Management  Yes;Weight Loss    Intervention  Weight Management: Develop a combined nutrition and exercise program designed to reach desired caloric intake, while maintaining appropriate intake of nutrient and  fiber, sodium and fats, and appropriate energy expenditure required for the weight goal.;Weight Management: Provide education and appropriate resources to help participant work on and attain dietary goals.;Weight Management/Obesity: Establish reasonable short term and long term weight goals.    Admit Weight  129 lb (58.5 kg) weighs at home every morning and keeps log    Goal Weight: Short Term  125 lb (56.7 kg)    Goal Weight: Long Term  120 lb (54.4 kg)    Expected Outcomes  Short Term: Continue to assess and modify interventions until short term weight is achieved;Long Term: Adherence to nutrition and physical activity/exercise program aimed toward attainment of established weight goal;Weight Loss: Understanding of general recommendations for a balanced deficit meal plan, which  promotes 1-2 lb weight loss per week and includes a negative energy balance of [phone] kcal/d;Understanding recommendations for meals to include 15-35% energy as protein, 25-35% energy from fat, 35-60% energy from carbohydrates, less than 210m of dietary cholesterol, 20-35 gm of total fiber daily;Understanding of distribution of calorie intake throughout the day with the consumption of 4-5 meals/snacks    Lipids  Yes    Intervention  Provide education and support for participant on nutrition & aerobic/resistive exercise along with prescribed medications to achieve LDL <747m HDL >4060m   Expected Outcomes  Short Term: Participant states understanding of desired cholesterol values and is compliant with medications prescribed. Participant is following exercise prescription and nutrition guidelines.;Long Term: Cholesterol controlled with medications as prescribed, with individualized exercise RX and with personalized nutrition plan. Value goals: LDL < 95m16mDL > 40 mg.    Stress  Yes Husban recently passed away (same day she had her NSTEMI). She is trying to figure out his trust and all the financial/emotional differences after losing a spouse.     Intervention  Offer individual and/or small group education and counseling on adjustment to heart disease, stress management and health-related lifestyle change. Teach and support self-help strategies.;Refer participants experiencing significant psychosocial distress to appropriate mental health specialists for further evaluation and treatment. When possible, include family members and significant others in education/counseling sessions.    Expected Outcomes  Short Term: Participant demonstrates changes in health-related behavior, relaxation and other stress management skills, ability to obtain effective social support, and compliance with psychotropic medications if prescribed.;Long Term: Emotional wellbeing is indicated by absence of clinically significant  psychosocial distress or social isolation.        Personal Goals Discharge: Goals and Risk Factor Review    Row Name 03/18/17 0840             Core Components/Risk Factors/Patient Goals Review   Personal Goals Review  Weight Management/Obesity;Hypertension;Lipids       Review  MartNovi been doing well in rehab.  Her blood pressures are high in the morning; she takes her meds with breakfast and blood pressures are better by the time she gets to rehab.  Her weight has been good and watching her sodium intake closely.        Expected Outcomes  Short: Meet with nutrition to discuss weight loss.  Long: Continue to work on risk factor modifications.           Exercise Goals and Review: Exercise Goals    Row Name 02/16/17 1558             Exercise Goals   Increase Physical Activity  Yes       Intervention  Provide advice, education, support and counseling about physical activity/exercise  needs.;Develop an individualized exercise prescription for aerobic and resistive training based on initial evaluation findings, risk stratification, comorbidities and participant's personal goals.       Expected Outcomes  Achievement of increased cardiorespiratory fitness and enhanced flexibility, muscular endurance and strength shown through measurements of functional capacity and personal statement of participant.       Increase Strength and Stamina  Yes       Intervention  Provide advice, education, support and counseling about physical activity/exercise needs.;Develop an individualized exercise prescription for aerobic and resistive training based on initial evaluation findings, risk stratification, comorbidities and participant's personal goals.       Expected Outcomes  Achievement of increased cardiorespiratory fitness and enhanced flexibility, muscular endurance and strength shown through measurements of functional capacity and personal statement of participant.       Able to understand and use  rate of perceived exertion (RPE) scale  Yes       Intervention  Provide education and explanation on how to use RPE scale       Expected Outcomes  Short Term: Able to use RPE daily in rehab to express subjective intensity level;Long Term:  Able to use RPE to guide intensity level when exercising independently       Knowledge and understanding of Target Heart Rate Range (THRR)  Yes       Intervention  Provide education and explanation of THRR including how the numbers were predicted and where they are located for reference       Expected Outcomes  Short Term: Able to state/look up THRR;Short Term: Able to use daily as guideline for intensity in rehab;Long Term: Able to use THRR to govern intensity when exercising independently       Able to check pulse independently  Yes       Intervention  Provide education and demonstration on how to check pulse in carotid and radial arteries.;Review the importance of being able to check your own pulse for safety during independent exercise       Expected Outcomes  Short Term: Able to explain why pulse checking is important during independent exercise;Long Term: Able to check pulse independently and accurately       Understanding of Exercise Prescription  Yes       Intervention  Provide education, explanation, and written materials on patient's individual exercise prescription       Expected Outcomes  Short Term: Able to explain program exercise prescription;Long Term: Able to explain home exercise prescription to exercise independently          Nutrition & Weight - Outcomes: Pre Biometrics - 02/16/17 1559      Pre Biometrics   Height  5' 2.9" (1.598 m)    Weight  135 lb 3.2 oz (61.3 kg)    Waist Circumference  32 inches    Hip Circumference  38 inches    Waist to Hip Ratio  0.84 %    BMI (Calculated)  24.02    Single Leg Stand  3.85 seconds      Post Biometrics - 04/01/17 0908       Post  Biometrics   Height  5' 2.9" (1.598 m)    Weight  136 lb  (61.7 kg)    Waist Circumference  33 inches    Hip Circumference  40 inches    Waist to Hip Ratio  0.82 %    BMI (Calculated)  24.16    Single Leg Stand  3.1 seconds  Nutrition: Nutrition Therapy & Goals - 03/18/17 0957      Nutrition Therapy   Drug/Food Interactions  Statins/Certain Fruits    Protein (specify units)  6-7    Fiber  25 grams    Saturated Fats  11 max. grams    Sodium  1500 grams      Personal Nutrition Goals   Nutrition Goal  Try 4oz cranberry juice to help prevent UTIs    Personal Goal #2  Increase fluid consumption throughout the day    Personal Goal #3  Choose dark leafy greens over iceburg lettuce more often      Intervention Plan   Intervention  Prescribe, educate and counsel regarding individualized specific dietary modifications aiming towards targeted core components such as weight, hypertension, lipid management, diabetes, heart failure and other comorbidities.    Expected Outcomes  Short Term Goal: Understand basic principles of dietary content, such as calories, fat, sodium, cholesterol and nutrients.;Short Term Goal: A plan has been developed with personal nutrition goals set during dietitian appointment.;Long Term Goal: Adherence to prescribed nutrition plan.       Nutrition Discharge: Nutrition Assessments - 04/08/17 0833      MEDFICTS Scores   Pre Score  60    Post Score  22    Score Difference  -38       Education Questionnaire Score: Knowledge Questionnaire Score - 04/08/17 2820      Knowledge Questionnaire Score   Pre Score  22/28    Post Score  26/28 results reviewed with pt today       Goals reviewed with patient; copy given to patient.

## 2017-04-13 NOTE — Progress Notes (Signed)
Daily Session Note  Patient Details  Name: Alicia Patterson MRN: [id number] Date of Birth: 02-23-1931 Referring Provider:     Cardiac Rehab from 02/16/2017 in [facility] Cardiac [facility]  Referring Provider  Alicia Cowman MD      Encounter Date: 04/13/2017  Check In: Session Check In - 04/13/17 0754      Check-In   Location  [facility]-Cardiac & Pulmonary Rehab    Staff Present  Alberteen Sam, MA, RCEP, CCRP, Exercise Physiologist;Kelly Amedeo Plenty, BS, ACSM CEP, Exercise Physiologist;Susanne Bice, RN, BSN, CCRP    Supervising physician immediately available to respond to emergencies  See telemetry face sheet for immediately available ER MD    Medication changes reported      No    Fall or balance concerns reported     No    Warm-up and Cool-down  Performed on first and last piece of equipment    Resistance Training Performed  Yes    VAD Patient?  No      Pain Assessment   Currently in Pain?  No/denies          Social History   Tobacco Use  Smoking Status Former Smoker  . Last attempt to quit: 1979  . Years since quitting: 40.1  Smokeless Tobacco Never Used    Goals Met:  Independence with exercise equipment Exercise tolerated well Personal goals reviewed No report of cardiac concerns or symptoms Strength training completed today  Goals Unmet:  Not Applicable  Comments:  Alicia Patterson graduated today from cardiac rehab with 36 sessions completed.  Details of the patient's exercise prescription and what She needs to do in order to continue the prescription and progress were discussed with patient.  Patient was given a copy of prescription and goals.  Patient verbalized understanding.  Alicia Patterson plans to continue to exercise by using equipment at home.    Dr. Emily Patterson is Medical Director for [facility].

## 2017-04-13 NOTE — Patient Instructions (Signed)
Discharge Patient Instructions  Patient Details  Name: Alicia Patterson MRN: [id number] Date of Birth: [birth year] Referring Provider:  Derinda Late, MD   Number of Visits: 36/36  Reason for Discharge:  Patient reached a stable level of exercise. Patient independent in their exercise. Patient has met program and personal goals.  Smoking History:  Social History   Tobacco Use  Smoking Status Former Smoker  . Last attempt to quit: 1979  . Years since quitting: 40.1  Smokeless Tobacco Never Used    Diagnosis:  No diagnosis found.  Initial Exercise Prescription: Initial Exercise Prescription - 02/16/17 1500      Date of Initial Exercise RX and Referring Provider   Date  02/16/17    Referring Provider  Paraschos, Alexander MD      Treadmill   MPH  2    Grade  0.5    Minutes  15    METs  2.67      Recumbant Bike   Level  1    RPM  50    Minutes  15    METs  1.7      NuStep   Level  1    SPM  80    Minutes  15    METs  1.7      Prescription Details   Frequency (times per week)  3    Duration  Progress to 45 minutes of aerobic exercise without signs/symptoms of physical distress      Intensity   THRR 40-80% of Max Heartrate  90-121    Ratings of Perceived Exertion  11-13    Perceived Dyspnea  0-4      Progression   Progression  Continue to progress workloads to maintain intensity without signs/symptoms of physical distress.      Resistance Training   Training Prescription  Yes    Weight  3 lbs    Reps  10-15       Discharge Exercise Prescription (Final Exercise Prescription Changes): Exercise Prescription Changes - 04/08/17 1400      Response to Exercise   Blood Pressure (Admit)  134/70    Blood Pressure (Exercise)  138/64    Blood Pressure (Exit)  118/60    Heart Rate (Admit)  70 bpm    Heart Rate (Exercise)  98 bpm    Heart Rate (Exit)  63 bpm    Rating of Perceived Exertion (Exercise)  12    Symptoms  none    Duration  Continue with 45 min  of aerobic exercise without signs/symptoms of physical distress.    Intensity  THRR unchanged      Progression   Progression  Continue to progress workloads to maintain intensity without signs/symptoms of physical distress.    Average METs  3.3      Resistance Training   Training Prescription  Yes    Weight  3 lbs    Reps  10-15      Interval Training   Interval Training  No      Treadmill   MPH  2.9    Grade  0.5    Minutes  15    METs  3.42      Recumbant Bike   Level  3    Watts  29    Minutes  15    METs  3.57      NuStep   Level  4    Minutes  15    METs  3.1  Home Exercise Plan   Plans to continue exercise at  Home (comment)    Frequency  Add 2 additional days to program exercise sessions. 2-4 days    Initial Home Exercises Provided  03/04/17       Functional Capacity: 6 Minute Walk    Row Name 02/16/17 1553 04/01/17 0908       6 Minute Walk   Phase  Initial  Discharge    Distance  1400 feet  1685 feet    Distance % Change  -  20.4 %    Distance Feet Change  -  285 ft    Walk Time  6 minutes  6 minutes    # of Rest Breaks  0  0    MPH  2.65  3.19    METS  1.71  3.02    RPE  11  13    VO2 Peak  5.97  10.57    Symptoms  No  No    Resting HR  60 bpm  62 bpm    Resting BP  104/56  116/52    Resting Oxygen Saturation   99 %  -    Exercise Oxygen Saturation  during 6 min walk  97 %  -    Max Ex. HR  112 bpm  96 bpm    Max Ex. BP  146/74  154/74    2 Minute Post BP  124/60  -       Quality of Life: Quality of Life - 04/08/17 0834      Quality of Life Scores   Health/Function Pre  25.29 %    Health/Function Post  26.18 %    Health/Function % Change  3.52 %    Socioeconomic Pre  27 %    Socioeconomic Post  27.07 %    Socioeconomic % Change   0.26 %    Psych/Spiritual Pre  28.29 %    Psych/Spiritual Post  25.71 %    Psych/Spiritual % Change  -9.12 %    Family Pre  28.8 %    Family Post  25.5 %    Family % Change  -11.46 %    GLOBAL Pre   26.71 %    GLOBAL Post  26.19 %    GLOBAL % Change  -1.95 %       Personal Goals: Goals established at orientation with interventions provided to work toward goal. Personal Goals and Risk Factors at Admission - 02/16/17 1358      Core Components/Risk Factors/Patient Goals on Admission    Weight Management  Yes;Weight Loss    Intervention  Weight Management: Develop a combined nutrition and exercise program designed to reach desired caloric intake, while maintaining appropriate intake of nutrient and fiber, sodium and fats, and appropriate energy expenditure required for the weight goal.;Weight Management: Provide education and appropriate resources to help participant work on and attain dietary goals.;Weight Management/Obesity: Establish reasonable short term and long term weight goals.    Admit Weight  129 lb (58.5 kg) weighs at home every morning and keeps log    Goal Weight: Short Term  125 lb (56.7 kg)    Goal Weight: Long Term  120 lb (54.4 kg)    Expected Outcomes  Short Term: Continue to assess and modify interventions until short term weight is achieved;Long Term: Adherence to nutrition and physical activity/exercise program aimed toward attainment of established weight goal;Weight Loss: Understanding of general recommendations for a balanced deficit meal plan,  which promotes 1-2 lb weight loss per week and includes a negative energy balance of [phone] kcal/d;Understanding recommendations for meals to include 15-35% energy as protein, 25-35% energy from fat, 35-60% energy from carbohydrates, less than 292m of dietary cholesterol, 20-35 gm of total fiber daily;Understanding of distribution of calorie intake throughout the day with the consumption of 4-5 meals/snacks    Lipids  Yes    Intervention  Provide education and support for participant on nutrition & aerobic/resistive exercise along with prescribed medications to achieve LDL <714m HDL >4040m   Expected Outcomes  Short Term:  Participant states understanding of desired cholesterol values and is compliant with medications prescribed. Participant is following exercise prescription and nutrition guidelines.;Long Term: Cholesterol controlled with medications as prescribed, with individualized exercise RX and with personalized nutrition plan. Value goals: LDL < 25m36mDL > 40 mg.    Stress  Yes Husban recently passed away (same day she had her NSTEMI). She is trying to figure out his trust and all the financial/emotional differences after losing a spouse.     Intervention  Offer individual and/or small group education and counseling on adjustment to heart disease, stress management and health-related lifestyle change. Teach and support self-help strategies.;Refer participants experiencing significant psychosocial distress to appropriate mental health specialists for further evaluation and treatment. When possible, include family members and significant others in education/counseling sessions.    Expected Outcomes  Short Term: Participant demonstrates changes in health-related behavior, relaxation and other stress management skills, ability to obtain effective social support, and compliance with psychotropic medications if prescribed.;Long Term: Emotional wellbeing is indicated by absence of clinically significant psychosocial distress or social isolation.        Personal Goals Discharge: Goals and Risk Factor Review - 03/18/17 0840      Core Components/Risk Factors/Patient Goals Review   Personal Goals Review  Weight Management/Obesity;Hypertension;Lipids    Review  MartJuvia been doing well in rehab.  Her blood pressures are high in the morning; she takes her meds with breakfast and blood pressures are better by the time she gets to rehab.  Her weight has been good and watching her sodium intake closely.     Expected Outcomes  Short: Meet with nutrition to discuss weight loss.  Long: Continue to work on risk factor  modifications.        Exercise Goals and Review: Exercise Goals    Row Name 02/16/17 1558             Exercise Goals   Increase Physical Activity  Yes       Intervention  Provide advice, education, support and counseling about physical activity/exercise needs.;Develop an individualized exercise prescription for aerobic and resistive training based on initial evaluation findings, risk stratification, comorbidities and participant's personal goals.       Expected Outcomes  Achievement of increased cardiorespiratory fitness and enhanced flexibility, muscular endurance and strength shown through measurements of functional capacity and personal statement of participant.       Increase Strength and Stamina  Yes       Intervention  Provide advice, education, support and counseling about physical activity/exercise needs.;Develop an individualized exercise prescription for aerobic and resistive training based on initial evaluation findings, risk stratification, comorbidities and participant's personal goals.       Expected Outcomes  Achievement of increased cardiorespiratory fitness and enhanced flexibility, muscular endurance and strength shown through measurements of functional capacity and personal statement of participant.       Able to understand  and use rate of perceived exertion (RPE) scale  Yes       Intervention  Provide education and explanation on how to use RPE scale       Expected Outcomes  Short Term: Able to use RPE daily in rehab to express subjective intensity level;Long Term:  Able to use RPE to guide intensity level when exercising independently       Knowledge and understanding of Target Heart Rate Range (THRR)  Yes       Intervention  Provide education and explanation of THRR including how the numbers were predicted and where they are located for reference       Expected Outcomes  Short Term: Able to state/look up THRR;Short Term: Able to use daily as guideline for intensity in  rehab;Long Term: Able to use THRR to govern intensity when exercising independently       Able to check pulse independently  Yes       Intervention  Provide education and demonstration on how to check pulse in carotid and radial arteries.;Review the importance of being able to check your own pulse for safety during independent exercise       Expected Outcomes  Short Term: Able to explain why pulse checking is important during independent exercise;Long Term: Able to check pulse independently and accurately       Understanding of Exercise Prescription  Yes       Intervention  Provide education, explanation, and written materials on patient's individual exercise prescription       Expected Outcomes  Short Term: Able to explain program exercise prescription;Long Term: Able to explain home exercise prescription to exercise independently          Nutrition & Weight - Outcomes: Pre Biometrics - 02/16/17 1559      Pre Biometrics   Height  5' 2.9" (1.598 m)    Weight  135 lb 3.2 oz (61.3 kg)    Waist Circumference  32 inches    Hip Circumference  38 inches    Waist to Hip Ratio  0.84 %    BMI (Calculated)  24.02    Single Leg Stand  3.85 seconds      Post Biometrics - 04/01/17 0908       Post  Biometrics   Height  5' 2.9" (1.598 m)    Weight  136 lb (61.7 kg)    Waist Circumference  33 inches    Hip Circumference  40 inches    Waist to Hip Ratio  0.82 %    BMI (Calculated)  24.16    Single Leg Stand  3.1 seconds       Nutrition: Nutrition Therapy & Goals - 03/18/17 0957      Nutrition Therapy   Drug/Food Interactions  Statins/Certain Fruits    Protein (specify units)  6-7    Fiber  25 grams    Saturated Fats  11 max. grams    Sodium  1500 grams      Personal Nutrition Goals   Nutrition Goal  Try 4oz cranberry juice to help prevent UTIs    Personal Goal #2  Increase fluid consumption throughout the day    Personal Goal #3  Choose dark leafy greens over iceburg lettuce more  often      Intervention Plan   Intervention  Prescribe, educate and counsel regarding individualized specific dietary modifications aiming towards targeted core components such as weight, hypertension, lipid management, diabetes, heart failure and other comorbidities.  Expected Outcomes  Short Term Goal: Understand basic principles of dietary content, such as calories, fat, sodium, cholesterol and nutrients.;Short Term Goal: A plan has been developed with personal nutrition goals set during dietitian appointment.;Long Term Goal: Adherence to prescribed nutrition plan.       Nutrition Discharge: Nutrition Assessments - 04/08/17 0833      MEDFICTS Scores   Pre Score  60    Post Score  22    Score Difference  -38       Education Questionnaire Score: Knowledge Questionnaire Score - 04/08/17 8563      Knowledge Questionnaire Score   Pre Score  22/28    Post Score  26/28 results reviewed with pt today       Goals reviewed with patient; copy given to patient.

## 2017-04-13 NOTE — Progress Notes (Signed)
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 Stress Concerns;Current Sleep Concerns;Current Depression       Comments  Tedra has been doing well in rehab.  She got bad news at the eye doctor yesterday as there is nothing they can do about her vision loss in her peripheral vision.  She needs new glasses.  It has really hit her hard.  She feels like crying quite often.  She is also not sleeping well and wakes up around 2-3am everyday and is not able to get back to sleep.  She  is still holding it in from when her husband passed.  She considers herself to be a stong person.n She hates having to rely on her daughter so much for help with driving and taking care of things around the house.  She has been getting help to get rid of his clothes.  She wants to get out to help others to make her feel better.        Expected Outcomes  Short:  Take time to herself and let things go.  Long: Continue to work on [employer] and accept help.        Interventions  Stress management education;Encouraged to attend Cardiac Rehabilitation for the exercise       Continue Psychosocial Services   Follow up required by staff       Comments  Husband recently passed away unexpectedly (on the same day she had her NSTEMI). She is trying to figure out his trust and all the financial issues with taxes coming up. 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 She wants to be as independent as possible so is trying to get stronger         Initial Review   Source of Stress Concerns  Financial;Family;Unable to perform yard/household activities          Psychosocial Discharge (Final Psychosocial Re-Evaluation): Psychosocial Re-Evaluation - 03/18/17 0831      Psychosocial Re-Evaluation   Current issues with  Current Stress Concerns;Current Sleep Concerns;Current Depression    Comments  Acquanetta has been doing well in rehab.  She got bad news at the eye doctor yesterday as there is nothing they can do about her vision loss in her peripheral vision.  She needs new glasses.  It has really hit her hard.  She feels like crying quite often.  She is also not sleeping well and wakes up around 2-3am everyday and is not able to get back to sleep.  She  is still holding it in from when her husband passed.  She considers herself to be a stong person.n She hates having to rely on her daughter so much for help with driving and taking care of things around the house.  She has been getting help to get rid of his clothes.  She wants to get out to help others to make her feel better.     Expected Outcomes  Short: Take time to herself and let things go.  Long: Continue to work on [employer] and accept help.     Interventions  Stress management education;Encouraged to attend Cardiac Rehabilitation for the exercise    Continue Psychosocial Services    Follow up required by staff    Comments  Husband recently passed away unexpectedly (on the same day she had her NSTEMI). She is trying to figure out his trust and all the financial issues with taxes coming up. 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 She wants to be as independent as possible so is trying to get stronger      Initial Review   Source of Stress Concerns  Financial;Family;Unable to perform yard/household activities       Vocational Rehabilitation: Provide vocational rehab assistance to qualifying candidates.   Vocational Rehab Evaluation & Intervention: Vocational Rehab - 02/16/17 1409      Initial Vocational Rehab Evaluation & Intervention   Assessment shows need for Vocational Rehabilitation  No       Education: Education Goals: Education classes will be provided on a variety of topics geared toward better understanding of heart health and risk factor modification. Participant will state understanding/return demonstration of topics presented as noted by education test scores.  Learning Barriers/Preferences: Learning Barriers/Preferences - 02/16/17 1408      Learning Barriers/Preferences   Learning Barriers  Sight    Learning Preferences  Individual Instruction       Education Topics:  AED/CPR: - Group verbal and written instruction with the use of models to demonstrate the basic use of the AED with the basic ABC's of resuscitation.   Cardiac Rehab from 04/13/2017 in [facility] Cardiac and Pulmonary Rehab  Date  04/13/17  Educator  SB  Instruction Review Code  1- Verbalizes Understanding      General Nutrition Guidelines/Fats and Fiber: -Group instruction  provided by verbal, written material, models and posters to present the general guidelines for heart healthy nutrition. Gives an explanation and review of dietary fats and fiber.   Cardiac Rehab from 04/13/2017 in [facility] Cardiac and Pulmonary Rehab  Date  03/30/17  Educator  CR  Instruction  Review Code  1- Verbalizes Understanding      Controlling Sodium/Reading Food Labels: -Group verbal and written material supporting the discussion of sodium use in heart healthy nutrition. Review and explanation with models, verbal and written materials for utilization of the food label.   Cardiac Rehab from 04/13/2017 in [facility] Cardiac and Pulmonary Rehab  Date  04/06/17  Educator  PI  Instruction Review Code  1- Verbalizes Understanding      Exercise Physiology & General Exercise Guidelines: - Group verbal and written instruction with models to review the exercise physiology of the cardiovascular system and associated critical values. Provides general exercise guidelines with specific guidelines to those with heart or lung disease.    Cardiac Rehab from 04/13/2017 in [facility] Cardiac and Pulmonary Rehab  Date  02/25/17  Educator  [facility]  Instruction Review Code  1- Verbalizes Understanding      Aerobic Exercise & Resistance Training: - Gives group verbal and written instruction on the various components of exercise. Focuses on aerobic and resistive training programs and the benefits of this training and how to safely progress through these programs..   Cardiac Rehab from 04/13/2017 in [facility] Cardiac and Pulmonary Rehab  Date  03/04/17  Educator  [facility]  Instruction Review Code  1- Verbalizes Understanding      Flexibility, Balance, Mind/Body Relaxation: Provides group verbal/written instruction on the benefits of flexibility and balance training, including mind/body exercise modes such as yoga, pilates and tai chi.  Demonstration and skill practice provided.   Cardiac Rehab from 04/13/2017 in [facility] Cardiac and Pulmonary Rehab  Date  03/09/17  Educator  [facility]  Instruction Review Code  1- Verbalizes Understanding      Stress and Anxiety: - Provides group verbal and written instruction about the health risks of elevated stress and causes of high stress.  Discuss the correlation between heart/lung  disease and anxiety and treatment options. Review healthy ways to manage with stress and anxiety.   Cardiac Rehab from 04/13/2017 in [facility] Cardiac and Pulmonary Rehab  Date  03/25/17  Educator  [facility]  Instruction Review Code  1- Verbalizes Understanding      Depression: - Provides group verbal and written instruction on the correlation between heart/lung disease and depressed mood, treatment options, and the stigmas associated with seeking treatment.   Anatomy & Physiology of the Heart: - Group verbal and written instruction and models provide basic cardiac anatomy and physiology, with the coronary electrical and arterial systems. Review of Valvular disease and Heart Failure   Cardiac Rehab from 04/13/2017 in [facility] Cardiac and Pulmonary Rehab  Date  03/23/17  Educator  [facility]  Instruction Review Code  1- Verbalizes Understanding      Cardiac Procedures: - Group verbal and written instruction to review commonly prescribed medications for heart disease. Reviews the medication, class of the drug, and side effects. Includes the steps to properly store meds and maintain the prescription regimen. (beta blockers and nitrates)   Cardiac Rehab from 04/13/2017 in [facility] Cardiac and Pulmonary Rehab  Date  04/01/17  Educator  SB  Instruction Review Code  1- Verbalizes Understanding      Cardiac Medications I: - Group verbal and written instruction to review commonly  prescribed medications for heart disease. Reviews the medication, class of the drug, and side effects. Includes the steps to properly store meds and maintain the prescription regimen.   Cardiac Medications II: -Group verbal and written instruction to review commonly prescribed medications for heart disease. Reviews the medication, class of the drug, and side effects. (all other drug classes)    Go Sex-Intimacy & Heart Disease, Get SMART - Goal Setting: - Group verbal and written instruction through game format to discuss heart disease and  the return to sexual intimacy. Provides group verbal and written material to discuss and apply goal setting through the application of the S.M.A.R.T. Method.   Cardiac Rehab from 04/13/2017 in [facility] Cardiac and Pulmonary Rehab  Date  04/01/17  Educator  SB  Instruction Review Code  1- Verbalizes Understanding      Other Matters of the Heart: - Provides group verbal, written materials and models to describe Stable Angina and Peripheral Artery. Includes description of the disease process and treatment options available to the cardiac patient.   Exercise & Equipment Safety: - Individual verbal instruction and demonstration of equipment use and safety with use of the equipment.   Cardiac Rehab from 04/13/2017 in [facility] Cardiac and Pulmonary Rehab  Date  02/16/17  Educator  [facility]  Instruction Review Code  1- Verbalizes Understanding      Infection Prevention: - Provides verbal and written material to individual with discussion of infection control including proper hand washing and proper equipment cleaning during exercise session.   Cardiac Rehab from 04/13/2017 in [facility] Cardiac and Pulmonary Rehab  Date  02/16/17  Educator  [facility]  Instruction Review Code  1- Verbalizes Understanding      Falls Prevention: - Provides verbal and written material to individual with discussion of falls prevention and safety.   Cardiac Rehab from 04/13/2017 in [facility] Cardiac and Pulmonary Rehab  Date  02/16/17  Educator  [facility]  Instruction Review Code  1- Verbalizes Understanding      Diabetes: - Individual verbal and written instruction to review signs/symptoms of diabetes, desired ranges of glucose level fasting, after meals and with exercise. Acknowledge that pre and post exercise glucose checks will be done for 3 sessions at entry of program.   Know Your Numbers and Risk Factors: -Group verbal and written instruction about important numbers in your health.  Discussion of what are risk factors and how they play a  role in the disease process.  Review of Cholesterol, Blood Pressure, Diabetes, and BMI and the role they play in your overall health.   Sleep Hygiene: -Provides group verbal and written instruction about how sleep can affect your health.  Define sleep hygiene, discuss sleep cycles and impact of sleep habits. Review good sleep hygiene tips.    Cardiac Rehab from 04/13/2017 in [facility] Cardiac and Pulmonary Rehab  Date  04/08/17  Educator  [facility]  Instruction Review Code  1- Verbalizes Understanding      Other: -Provides group and verbal instruction on various topics (see comments)   Cardiac Rehab from 04/13/2017 in [facility] Cardiac and Pulmonary Rehab  Date  03/11/17  Educator  [facility]  Instruction Review Code  1- Verbalizes Understanding [Risk Factors and Know Your Numbers]      Knowledge Questionnaire Score: Knowledge Questionnaire Score - 04/08/17 0833      Knowledge Questionnaire Score   Pre Score  22/28    Post Score  26/28 results reviewed with pt today       Core Components/Risk Factors/Patient  Goals at Admission: Personal Goals and Risk Factors at Admission - 02/16/17 1358      Core Components/Risk Factors/Patient Goals on Admission    Weight Management  Yes;Weight Loss    Intervention  Weight Management: Develop a combined nutrition and exercise program designed to reach desired caloric intake, while maintaining appropriate intake of nutrient and fiber, sodium and fats, and appropriate energy expenditure required for the weight goal.;Weight Management: Provide education and appropriate resources to help participant work on and attain dietary goals.;Weight Management/Obesity: Establish reasonable short term and long term weight goals.    Admit Weight  129 lb (58.5 kg) weighs at home every morning and keeps log    Goal Weight: Short Term  125 lb (56.7 kg)    Goal Weight: Long Term  120 lb (54.4 kg)    Expected Outcomes  Short Term: Continue to assess and modify interventions until short  term weight is achieved;Long Term: Adherence to nutrition and physical activity/exercise program aimed toward attainment of established weight goal;Weight Loss: Understanding of general recommendations for a balanced deficit meal plan, which promotes 1-2 lb weight loss per week and includes a negative energy balance of [phone] kcal/d;Understanding recommendations for meals to include 15-35% energy as protein, 25-35% energy from fat, 35-60% energy from carbohydrates, less than 261m of dietary cholesterol, 20-35 gm of total fiber daily;Understanding of distribution of calorie intake throughout the day with the consumption of 4-5 meals/snacks    Lipids  Yes    Intervention  Provide education and support for participant on nutrition & aerobic/resistive exercise along with prescribed medications to achieve LDL <791m HDL >4059m   Expected Outcomes  Short Term: Participant states understanding of desired cholesterol values and is compliant with medications prescribed. Participant is following exercise prescription and nutrition guidelines.;Long Term: Cholesterol controlled with medications as prescribed, with individualized exercise RX and with personalized nutrition plan. Value goals: LDL < 37m15mDL > 40 mg.    Stress  Yes Husban recently passed away (same day she had her NSTEMI). She is trying to figure out his trust and all the financial/emotional differences after losing a spouse.     Intervention  Offer individual and/or small group education and counseling on adjustment to heart disease, stress management and health-related lifestyle change. Teach and support self-help strategies.;Refer participants experiencing significant psychosocial distress to appropriate mental health specialists for further evaluation and treatment. When possible, include family members and significant others in education/counseling sessions.    Expected Outcomes  Short Term: Participant demonstrates changes in health-related  behavior, relaxation and other stress management skills, ability to obtain effective social support, and compliance with psychotropic medications if prescribed.;Long Term: Emotional wellbeing is indicated by absence of clinically significant psychosocial distress or social isolation.       Core Components/Risk Factors/Patient Goals Review:  Goals and Risk Factor Review    Row Name 03/18/17 0840             Core Components/Risk Factors/Patient Goals Review   Personal Goals Review  Weight Management/Obesity;Hypertension;Lipids       Review  MartHayes been doing well in rehab.  Her blood pressures are high in the morning; she takes her meds with breakfast and blood pressures are better by the time she gets to rehab.  Her weight has been good and watching her sodium intake closely.        Expected Outcomes  Short: Meet with nutrition to discuss weight loss.  Long: Continue to work on risk factor modifications.  Core Components/Risk Factors/Patient Goals at Discharge (Final Review):  Goals and Risk Factor Review - 03/18/17 0840      Core Components/Risk Factors/Patient Goals Review   Personal Goals Review  Weight Management/Obesity;Hypertension;Lipids    Review  Kanesha has been doing well in rehab.  Her blood pressures are high in the morning; she takes her meds with breakfast and blood pressures are better by the time she gets to rehab.  Her weight has been good and watching her sodium intake closely.     Expected Outcomes  Short: Meet with nutrition to discuss weight loss.  Long: Continue to work on risk factor modifications.        ITP Comments: ITP Comments    Row Name 02/16/17 1305 03/04/17 0627 04/01/17 0553 04/13/17 0756     ITP Comments  Med Review completed. Initial ITP created. Diagnosis can be found in Care Everywhere 12/23/16  30 day review. Continue with ITP unless directed changes per Medical Director review.   30 Day review. Continue with ITP unless directed  changes per Medical Director review.   Discharge ITP sent and signed by Dr. Sabra Heck.  Discharge Summary routed to PCP and cardiologist.       Comments: Discharge ITP

## 2017-06-15 ENCOUNTER — Other Ambulatory Visit: Payer: Self-pay | Admitting: Family Medicine

## 2017-06-15 DIAGNOSIS — Z1231 Encounter for screening mammogram for malignant neoplasm of breast: Secondary | ICD-10-CM

## 2018-01-15 ENCOUNTER — Other Ambulatory Visit: Payer: Self-pay

## 2018-01-15 ENCOUNTER — Inpatient Hospital Stay
Admission: EM | Admit: 2018-01-15 | Discharge: 2018-01-16 | DRG: 313 | Disposition: A | Discharge status: Home / Self Care | Payer: Medicare Other | Attending: Internal Medicine | Admitting: Internal Medicine

## 2018-01-15 ENCOUNTER — Encounter: Payer: Self-pay | Admitting: Emergency Medicine

## 2018-01-15 ENCOUNTER — Emergency Department: Payer: Medicare Other

## 2018-01-15 DIAGNOSIS — R079 Chest pain, unspecified: Secondary | ICD-10-CM | POA: Diagnosis present

## 2018-01-15 DIAGNOSIS — Z9049 Acquired absence of other specified parts of digestive tract: Secondary | ICD-10-CM | POA: Diagnosis not present

## 2018-01-15 DIAGNOSIS — Z881 Allergy status to other antibiotic agents status: Secondary | ICD-10-CM

## 2018-01-15 DIAGNOSIS — Z87891 Personal history of nicotine dependence: Secondary | ICD-10-CM

## 2018-01-15 DIAGNOSIS — Z882 Allergy status to sulfonamides status: Secondary | ICD-10-CM | POA: Diagnosis not present

## 2018-01-15 DIAGNOSIS — Z7902 Long term (current) use of antithrombotics/antiplatelets: Secondary | ICD-10-CM | POA: Diagnosis not present

## 2018-01-15 DIAGNOSIS — Z888 Allergy status to other drugs, medicaments and biological substances status: Secondary | ICD-10-CM | POA: Diagnosis not present

## 2018-01-15 DIAGNOSIS — R0789 Other chest pain: Secondary | ICD-10-CM | POA: Diagnosis not present

## 2018-01-15 DIAGNOSIS — I252 Old myocardial infarction: Secondary | ICD-10-CM

## 2018-01-15 DIAGNOSIS — Z90710 Acquired absence of both cervix and uterus: Secondary | ICD-10-CM | POA: Diagnosis not present

## 2018-01-15 DIAGNOSIS — Z79899 Other long term (current) drug therapy: Secondary | ICD-10-CM | POA: Diagnosis not present

## 2018-01-15 DIAGNOSIS — H409 Unspecified glaucoma: Secondary | ICD-10-CM | POA: Diagnosis not present

## 2018-01-15 DIAGNOSIS — I10 Essential (primary) hypertension: Secondary | ICD-10-CM | POA: Diagnosis present

## 2018-01-15 DIAGNOSIS — E876 Hypokalemia: Secondary | ICD-10-CM | POA: Diagnosis present

## 2018-01-15 DIAGNOSIS — Z803 Family history of malignant neoplasm of breast: Secondary | ICD-10-CM

## 2018-01-15 DIAGNOSIS — Z95810 Presence of automatic (implantable) cardiac defibrillator: Secondary | ICD-10-CM

## 2018-01-15 DIAGNOSIS — E785 Hyperlipidemia, unspecified: Secondary | ICD-10-CM | POA: Diagnosis not present

## 2018-01-15 DIAGNOSIS — I2510 Atherosclerotic heart disease of native coronary artery without angina pectoris: Secondary | ICD-10-CM | POA: Diagnosis present

## 2018-01-15 DIAGNOSIS — Z7982 Long term (current) use of aspirin: Secondary | ICD-10-CM

## 2018-01-15 DIAGNOSIS — I083 Combined rheumatic disorders of mitral, aortic and tricuspid valves: Secondary | ICD-10-CM | POA: Diagnosis present

## 2018-01-15 DIAGNOSIS — K219 Gastro-esophageal reflux disease without esophagitis: Secondary | ICD-10-CM | POA: Diagnosis not present

## 2018-01-15 LAB — HEMOGLOBIN A1C
Hgb A1c MFr Bld: 5.3 % (ref 4.8–5.6)
Mean Plasma Glucose: 105.41 mg/dL

## 2018-01-15 LAB — CBC
HEMATOCRIT: 36 % (ref 36.0–46.0)
Hemoglobin: 11.5 g/dL — ABNORMAL LOW (ref 12.0–15.0)
MCH: 30.5 pg (ref 26.0–34.0)
MCHC: 31.9 g/dL (ref 30.0–36.0)
MCV: 95.5 fL (ref 80.0–100.0)
NRBC: 0 % (ref 0.0–0.2)
PLATELETS: 163 10*3/uL (ref 150–400)
RBC: 3.77 MIL/uL — AB (ref 3.87–5.11)
RDW: 13.1 % (ref 11.5–15.5)
WBC: 11.1 10*3/uL — ABNORMAL HIGH (ref 4.0–10.5)

## 2018-01-15 LAB — BASIC METABOLIC PANEL
Anion gap: 5 (ref 5–15)
BUN: 17 mg/dL (ref 8–23)
CHLORIDE: 108 mmol/L (ref 98–111)
CO2: 27 mmol/L (ref 22–32)
Calcium: 8.5 mg/dL — ABNORMAL LOW (ref 8.9–10.3)
Creatinine, Ser: 0.74 mg/dL (ref 0.44–1.00)
GFR calc Af Amer: >60 mL/min (ref >60)
GFR calc non Af Amer: >60 mL/min (ref >60)
Glucose, Bld: 115 mg/dL — ABNORMAL HIGH (ref 70–99)
POTASSIUM: 3.7 mmol/L (ref 3.5–5.1)
SODIUM: 140 mmol/L (ref 135–145)

## 2018-01-15 LAB — TROPONIN I
Troponin I: <0.03 ng/mL (ref <0.03)
Troponin I: <0.03 ng/mL (ref <0.03)
Troponin I: <0.03 ng/mL (ref <0.03)
Troponin I: <0.03 ng/mL (ref <0.03)

## 2018-01-15 LAB — TSH: TSH: 0.838 u[IU]/mL (ref 0.350–4.500)

## 2018-01-15 MED ORDER — ACETAMINOPHEN 650 MG RE SUPP: 650.0000 mg | Freq: Four times a day (QID) | RECTAL | Status: DC | PRN

## 2018-01-15 MED ORDER — ONDANSETRON HCL 4 MG/2ML IJ SOLN: 4.0000 mg | Freq: Four times a day (QID) | INTRAMUSCULAR | Status: DC | PRN

## 2018-01-15 MED ORDER — CLOBETASOL PROPIONATE 0.05 % EX OINT
1.0000 "application " | TOPICAL_OINTMENT | CUTANEOUS | Status: DC
  Filled 2018-01-15: qty 15

## 2018-01-15 MED ORDER — ONDANSETRON HCL 4 MG PO TABS: 4.0000 mg | ORAL_TABLET | Freq: Four times a day (QID) | ORAL | Status: DC | PRN

## 2018-01-15 MED ORDER — LIDOCAINE VISCOUS HCL 2 % MT SOLN
15.0000 mL | Freq: Once | OROMUCOSAL | Status: AC
  Administered 2018-01-15: 15 mL via ORAL
  Filled 2018-01-15: qty 15

## 2018-01-15 MED ORDER — LATANOPROST 0.005 % OP SOLN
1.0000 [drp] | Freq: Every day | OPHTHALMIC | Status: DC
  Administered 2018-01-15: 1 [drp] via OPHTHALMIC
  Filled 2018-01-15 (×2): qty 2.5

## 2018-01-15 MED ORDER — BRIMONIDINE TARTRATE-TIMOLOL 0.2-0.5 % OP SOLN: 1.0000 [drp] | Freq: Two times a day (BID) | OPHTHALMIC | Status: DC

## 2018-01-15 MED ORDER — ENOXAPARIN SODIUM 40 MG/0.4ML ~~LOC~~ SOLN
40.0000 mg | SUBCUTANEOUS | Status: DC
  Administered 2018-01-15 – 2018-01-16 (×2): 40 mg via SUBCUTANEOUS
  Filled 2018-01-15 (×2): qty 0.4

## 2018-01-15 MED ORDER — ATORVASTATIN CALCIUM 20 MG PO TABS
40.0000 mg | ORAL_TABLET | Freq: Every day | ORAL | Status: DC
  Administered 2018-01-15: 40 mg via ORAL
  Filled 2018-01-15: qty 2

## 2018-01-15 MED ORDER — BRIMONIDINE TARTRATE 0.2 % OP SOLN
1.0000 [drp] | Freq: Two times a day (BID) | OPHTHALMIC | Status: DC
  Administered 2018-01-15 – 2018-01-16 (×3): 1 [drp] via OPHTHALMIC
  Filled 2018-01-15: qty 5

## 2018-01-15 MED ORDER — CARVEDILOL 3.125 MG PO TABS
3.1250 mg | ORAL_TABLET | Freq: Two times a day (BID) | ORAL | Status: DC
  Administered 2018-01-15 – 2018-01-16 (×3): 3.125 mg via ORAL
  Filled 2018-01-15 (×3): qty 1

## 2018-01-15 MED ORDER — ALUM & MAG HYDROXIDE-SIMETH 200-200-20 MG/5ML PO SUSP
30.0000 mL | Freq: Once | ORAL | Status: AC
  Administered 2018-01-15: 30 mL via ORAL
  Filled 2018-01-15: qty 30

## 2018-01-15 MED ORDER — DORZOLAMIDE HCL 2 % OP SOLN
1.0000 [drp] | Freq: Every day | OPHTHALMIC | Status: DC
  Administered 2018-01-15 – 2018-01-16 (×2): 1 [drp] via OPHTHALMIC
  Filled 2018-01-15: qty 10

## 2018-01-15 MED ORDER — LISINOPRIL 5 MG PO TABS
5.0000 mg | ORAL_TABLET | Freq: Every day | ORAL | Status: DC
  Administered 2018-01-15 – 2018-01-16 (×2): 5 mg via ORAL
  Filled 2018-01-15 (×2): qty 1

## 2018-01-15 MED ORDER — CYANOCOBALAMIN 500 MCG PO TABS
500.0000 ug | ORAL_TABLET | Freq: Every day | ORAL | Status: DC
  Administered 2018-01-15 – 2018-01-16 (×2): 500 ug via ORAL
  Filled 2018-01-15 (×2): qty 1

## 2018-01-15 MED ORDER — PANTOPRAZOLE SODIUM 40 MG PO TBEC
40.0000 mg | DELAYED_RELEASE_TABLET | Freq: Every day | ORAL | Status: DC
  Administered 2018-01-15 – 2018-01-16 (×2): 40 mg via ORAL
  Filled 2018-01-15 (×2): qty 1

## 2018-01-15 MED ORDER — TIMOLOL MALEATE 0.5 % OP SOLN
1.0000 [drp] | Freq: Two times a day (BID) | OPHTHALMIC | Status: DC
  Administered 2018-01-15 – 2018-01-16 (×3): 1 [drp] via OPHTHALMIC
  Filled 2018-01-15: qty 5

## 2018-01-15 MED ORDER — CLOPIDOGREL BISULFATE 75 MG PO TABS
75.0000 mg | ORAL_TABLET | Freq: Every day | ORAL | Status: DC
  Administered 2018-01-15 – 2018-01-16 (×2): 75 mg via ORAL
  Filled 2018-01-15 (×2): qty 1

## 2018-01-15 MED ORDER — ASPIRIN EC 325 MG PO TBEC
325.0000 mg | DELAYED_RELEASE_TABLET | Freq: Every day | ORAL | Status: DC
  Administered 2018-01-15 – 2018-01-16 (×2): 325 mg via ORAL
  Filled 2018-01-15 (×2): qty 1

## 2018-01-15 MED ORDER — NITROGLYCERIN 0.4 MG SL SUBL: 0.4000 mg | SUBLINGUAL_TABLET | SUBLINGUAL | Status: DC | PRN

## 2018-01-15 MED ORDER — NITROFURANTOIN MONOHYD MACRO 100 MG PO CAPS
100.0000 mg | ORAL_CAPSULE | Freq: Every day | ORAL | Status: DC
  Filled 2018-01-15 (×2): qty 1

## 2018-01-15 MED ORDER — DOCUSATE SODIUM 100 MG PO CAPS
100.0000 mg | ORAL_CAPSULE | Freq: Two times a day (BID) | ORAL | Status: DC
  Administered 2018-01-15 – 2018-01-16 (×3): 100 mg via ORAL
  Filled 2018-01-15 (×3): qty 1

## 2018-01-15 MED ORDER — ACETAMINOPHEN 325 MG PO TABS: 650.0000 mg | ORAL_TABLET | Freq: Four times a day (QID) | ORAL | Status: DC | PRN

## 2018-01-15 NOTE — ED Notes (Signed)
Pillow given to pt. Pt reassured that we would notify her when she has an assigned room.

## 2018-01-15 NOTE — Plan of Care (Signed)
  Problem: Clinical Measurements: Goal: Ability to maintain clinical measurements within normal limits will improve Outcome: Not Progressing Note:  RBC's are only 3.8 today. Will continue to monitor lab values. Alicia Patterson [facility]

## 2018-01-15 NOTE — Consult Note (Signed)
Cardiology Consultation Note    Patient ID: Alicia Patterson, MRN: [id number], DOB/AGE: September 18, 1930 84 y.o. Admit date: 01/15/2018   Date of Consult: 01/15/2018 Primary Physician: Isaias Cowman, MD Primary Cardiologist: Dr. Saralyn Pilar  Chief Complaint: chest pain Reason for Consultation: chest pain Requesting MD: Dr. Brett Albino  HPI: Alicia Patterson is a 84 y.o. female with history of  non-STEMI. Cardiac catheterization 12/15/2016 revealed calcified 90% proximal LAD, 100% RCA, with moderate to severely reduced left ventricular function. The patient was transferred to [facility] for complex PCI versus bypass graft surgery. The patient underwent rotational atherectomy followed by 2.5 x 12 mm DES mid, and 2.5 x 18 mm proximal LAD. Left ventriculography revealed LV ejection fraction of 35%, moderate mitral and tricuspid regurgitation, with mild aortic stenosis. She has a dual chamber icd in place. She is treated with atorvastatin, carvedilol, asa and plavix, liniopril as outpatient. She was admitted with chest pain and has ruled out for an mi.  Patient states she was awoken from sleep with a severe epigastric chest discomfort.  Had features somewhat different than her angina.  Radiated through to her back.  This was associated with an episode of nausea diaphoresis and dizziness.  Interrogation of her defibrillator revealed no arrhythmias at this time.  She has ruled out for a myocardial infarction.  She remains fairly active without difficulty.  He is status post cholecystectomy.   Past Medical History:  Diagnosis Date  . Cancer ([place])   . Coronary artery disease   . GERD (gastroesophageal reflux disease)   . Glaucoma       Surgical History:  Past Surgical History:  Procedure Laterality Date  . ABDOMINAL HYSTERECTOMY    . BREAST BIOPSY Left 05/22/2016   neg-ORGANIZING FAT NECROSIS. VASCULAR CALCS   . CHOLECYSTECTOMY    . LEFT HEART CATH AND CORONARY ANGIOGRAPHY N/A 12/15/2016   Procedure: LEFT HEART  CATH AND CORONARY ANGIOGRAPHY;  Surgeon: Dionisio David, MD;  Location: [facility];  Service: Cardiovascular;  Laterality: N/A;  . TONSILLECTOMY       Home Meds: Prior to Admission medications   Medication Sig Start Date End Date Taking? Authorizing Provider  aspirin EC 81 MG tablet Take 81 mg by mouth daily.   Yes [provider]  atorvastatin (LIPITOR) 40 MG tablet Take 1 tablet (40 mg total) by mouth daily at 6 PM. 12/15/16  Yes Epifanio Lesches, MD  brimonidine-timolol (COMBIGAN) 0.2-0.5 % ophthalmic solution Place 1 drop into both eyes every 12 (twelve) hours.   Yes [provider]  carvedilol (COREG) 6.25 MG tablet Take 6.25 mg by mouth 2 (two) times daily with a meal.  02/13/17  Yes [provider]  clopidogrel (PLAVIX) 75 MG tablet Take 75 mg by mouth daily.  02/13/17  Yes [provider]  dorzolamide (TRUSOPT) 2 % ophthalmic solution Place 1 drop into both eyes daily. 08/31/15  Yes [provider]  latanoprost (XALATAN) 0.005 % ophthalmic solution Place 1 drop into both eyes daily.   Yes [provider]  lisinopril (PRINIVIL,ZESTRIL) 5 MG tablet Take 5 mg by mouth daily.   Yes [provider]  nitroGLYCERIN (NITROSTAT) 0.3 MG SL tablet Place 1 tablet (0.3 mg total) under the tongue every 5 (five) minutes as needed for chest pain. 12/15/16 01/15/18 Yes Epifanio Lesches, MD  pantoprazole (PROTONIX) 40 MG tablet Take 40 mg by mouth daily.  02/13/17  Yes [provider]  aspirin EC 325 MG EC tablet Take 1  tablet (325 mg total) by mouth daily. Patient not taking: Reported on 01/15/2018 12/16/16   Epifanio Lesches, MD  clobetasol ointment (TEMOVATE) 9.83 % Apply 1 application topically every other day. 11/06/16   [provider]  cyanocobalamin 500 MCG tablet Take 500 mcg by mouth daily.    [provider]  Glucosamine-Chondroit-Vit C-Mn (GLUCOSAMINE-CHONDROITIN MAX ST) CAPS Take by  mouth.    [provider]  Multiple Vitamin (MULTI-VITAMINS) TABS Take by mouth.    [provider]  nitrofurantoin, macrocrystal-monohydrate, (MACROBID) 100 MG capsule Take by mouth daily. 01/15/17   [provider]    Inpatient Medications:  . aspirin  325 mg Oral Daily  . atorvastatin  40 mg Oral q1800  . brimonidine  1 drop Both Eyes Q12H   And  . timolol  1 drop Both Eyes Q12H  . carvedilol  3.125 mg Oral BID WC  . clobetasol ointment  1 application Topical QODAY  . clopidogrel  75 mg Oral Daily  . docusate sodium  100 mg Oral BID  . dorzolamide  1 drop Both Eyes Daily  . enoxaparin (LOVENOX) injection  40 mg Subcutaneous Q24H  . latanoprost  1 drop Both Eyes QHS  . lisinopril  5 mg Oral Daily  . nitrofurantoin (macrocrystal-monohydrate)  100 mg Oral Daily  . pantoprazole  40 mg Oral Daily  . vitamin B-12  500 mcg Oral Daily     Allergies:  Allergies  Allergen Reactions  . Amoxicillin-Pot Clavulanate     Other reaction(s): Unknown  Pt states she is not allergic to this medication  . Cefdinir     Other reaction(s): Other (See Comments)  . Gatifloxacin     Other reaction(s): Unknown Other reaction(s): Other (See Comments)  . Sulfa Antibiotics Rash  . Sulfasalazine Rash    Social History   Socioeconomic History  . Marital status: Widowed    Spouse name: Not on file  . Number of children: Not on file  . Years of education: Not on file  . Highest education level: Not on file  Occupational History  . Not on file  Social Needs  . Financial resource strain: Not on file  . Food insecurity:    Worry: Not on file    Inability: Not on file  . Transportation needs:    Medical: Not on file    Non-medical: Not on file  Tobacco Use  . Smoking status: Former Smoker    Last attempt to quit: 1979    Years since quitting: 40.8  . Smokeless tobacco: Never Used  Substance and Sexual Activity  . Alcohol use: No  . Drug use: No  . Sexual  activity: Not Currently  Lifestyle  . Physical activity:    Days per week: Not on file    Minutes per session: Not on file  . Stress: Not on file  Relationships  . Social connections:    Talks on phone: Not on file    Gets together: Not on file    Attends religious service: Not on file    Active member of club or organization: Not on file    Attends meetings of clubs or organizations: Not on file    Relationship status: Not on file  . Intimate partner violence:    Fear of current or ex partner: Not on file    Emotionally abused: Not on file    Physically abused: Not on file    Forced sexual activity: Not on file  Other  Topics Concern  . Not on file  Social History Narrative  . Not on file     Family History  Problem Relation Age of Onset  . Breast cancer Sister 16  . Breast cancer Other 50     Review of Systems: A 12-system review of systems was performed and is negative except as noted in the HPI.  Labs: Recent Labs    01/15/18 0258 01/15/18 0920  TROPONINI <0.03 <0.03   Lab Results  Component Value Date   WBC 11.1 (H) 01/15/2018   HGB 11.5 (L) 01/15/2018   HCT 36.0 01/15/2018   MCV 95.5 01/15/2018   PLT 163 01/15/2018    Recent Labs  Lab 01/15/18 0258  NA 140  K 3.7  CL 108  CO2 27  BUN 17  CREATININE 0.74  CALCIUM 8.5*  GLUCOSE 115*   Lab Results  Component Value Date   CHOL 144 12/15/2016   HDL 47 12/15/2016   LDLCALC 92 12/15/2016   TRIG 27 12/15/2016   No results found for: DDIMER  Radiology/Studies:  Dg Chest Portable 1 View  Result Date: 01/15/2018 CLINICAL DATA:  Acute onset of midsternal chest pain, diaphoresis and nausea. EXAM: PORTABLE CHEST 1 VIEW COMPARISON:  Chest radiograph and CTA of the chest performed 12/14/2016 FINDINGS: The lungs are well-aerated. Minimal bibasilar atelectasis is noted. There is no evidence of pleural effusion or pneumothorax. The cardiomediastinal silhouette is normal in size. A pacemaker/AICD is noted  overlying the left chest wall, with leads ending overlying the right atrium and right ventricle. No acute osseous abnormalities are seen. IMPRESSION: Minimal bibasilar atelectasis noted.  Lungs otherwise clear. Electronically Signed   By: Garald Balding M.D.   On: 01/15/2018 03:25    Wt Readings from Last 3 Encounters:  01/15/18 62.1 kg  04/01/17 61.7 kg  02/16/17 61.3 kg    EKG: Normal sinus rhythm with nonspecific ST-T wave changes.  Physical Exam:  Blood pressure (!) 142/61, pulse 76, temperature 99.3 F (37.4 C), temperature source Oral, resp. rate 16, height 5' 3.5" (1.613 m), weight 62.1 kg, SpO2 98 %. Body mass index is 23.85 kg/m. General: Well developed, well nourished, in no acute distress. Head: Normocephalic, atraumatic, sclera non-icteric, no xanthomas, nares are without discharge.  Neck: Negative for carotid bruits. JVD not elevated. Lungs: Clear bilaterally to auscultation without wheezes, rales, or rhonchi. Breathing is unlabored. Heart: RRR with S1 S2. No murmurs, rubs, or gallops appreciated. Abdomen: Soft, non-tender, non-distended with normoactive bowel sounds. No hepatomegaly. No rebound/guarding. No obvious abdominal masses. Msk:  Strength and tone appear normal for age. Extremities: No clubbing or cyanosis. No edema.  Distal pedal pulses are 2+ and equal bilaterally. Neuro: Alert and oriented X 3. No facial asymmetry. No focal deficit. Moves all extremities spontaneously. Psych:  Responds to questions appropriately with a normal affect.     Assessment and Plan  84 y.o. female with history of  non-STEMI. Cardiac catheterization 12/15/2016 revealed calcified 90% proximal LAD, 100% RCA, with moderate to severely reduced left ventricular function. The patient was transferred to [facility] for complex PCI versus bypass graft surgery. The patient underwent rotational atherectomy followed by 2.5 x 12 mm DES mid, and 2.5 x 18 mm proximal LAD. Left ventriculography revealed LV  ejection fraction of 35%, moderate mitral and tricuspid regurgitation, with mild aortic stenosis. She has a dual chamber icd in place.  She was admitted with an episode of chest pain.  She has ruled out for myocardial infarction.  Pain  was somewhat different than her angina.  She is status post cholecystectomy.  Chest pain-etiology unclear.  Is ruled out for myocardial infarction.  Interrogation of her defibrillator showed no treatment at the time she had the symptoms.  She has had no sensing of ventricular or supraventricular problems.  We will continue with current regimen and follow-up.  Pain appeared to have a component of gastrointestinal.  Consideration for a etiology needs to be raised.  Would continue with aspirin and Plavix along with carvedilol at 3.125 mg twice daily and lisinopril.  Signed, Teodoro Spray MD 01/15/2018, 11:49 AM Pager: [phone]

## 2018-01-15 NOTE — ED Triage Notes (Signed)
Pt to triage via w/c with no distress noted; st awoke hr PTA with midsternal CP, nonradiating accomp by diaphoresis and nausea; pt with pacemaker/defib

## 2018-01-15 NOTE — ED Notes (Signed)
ED TO INPATIENT HANDOFF REPORT  Name/Age/Gender Alicia Patterson 84 y.o. female  Code Status Code Status History    Date Active Date Inactive Code Status Order ID Comments User Context   12/15/2016 0324 12/16/2016 0139 Full Code [id number]  Gorden Harms, MD Inpatient   12/29/2014 2334 01/01/2015 1457 Partial Code [id number]  Leonie Green, MD Inpatient    Advance Directive Documentation     Most Recent Value  Type of Advance Directive  Healthcare Power of Attorney, Living will  Pre-existing out of facility DNR order (yellow form or pink MOST form)  -  "MOST" Form in Place?  -      Home/SNF/Other Home   Chief Complaint Chest pain, heart racing  Level of Care/Admitting Diagnosis ED Disposition    ED Disposition Condition [place]: [place] [[id number]]  Level of Care: Telemetry [5]  Diagnosis: Chest pain [845364]  Admitting Physician: Harrie Foreman [[id number]]  Attending Physician: Harrie Foreman [[id number]]  Estimated length of stay: past midnight tomorrow  Certification:: I certify this patient will need inpatient services for at least 2 midnights  PT Class (Do Not Modify): Inpatient [101]  PT Acc Code (Do Not Modify): Private [1]       Medical History Past Medical History:  Diagnosis Date  . Cancer ([place])   . Coronary artery disease   . GERD (gastroesophageal reflux disease)   . Glaucoma     Allergies Allergies  Allergen Reactions  . Amoxicillin-Pot Clavulanate     Other reaction(s): Unknown  Pt states she is not allergic to this medication  . Cefdinir     Other reaction(s): Other (See Comments)  . Gatifloxacin     Other reaction(s): Unknown Other reaction(s): Other (See Comments)  . Sulfa Antibiotics Rash  . Sulfasalazine Rash    IV Location/Drains/Wounds Patient Lines/Drains/Airways Status   Active Line/Drains/Airways    Name:   Placement date:   Placement time:   Site:   Days:   Peripheral  IV 12/14/16 Right;Posterior Arm   12/14/16    2000    Arm   397   Peripheral IV 12/15/16 Left Wrist   12/15/16    0700    Wrist   396   Peripheral IV 01/15/18 Left Antecubital   01/15/18    0255    Antecubital   less than 1   External Urinary Catheter   12/15/16    1955    -   396          Labs/Imaging Results for orders placed or performed during the hospital encounter of 01/15/18 (from the past 48 hour(s))  Basic metabolic panel     Status: Abnormal   Collection Time: 01/15/18  2:58 AM  Result Value Ref Range   Sodium 140 135 - 145 mmol/L   Potassium 3.7 3.5 - 5.1 mmol/L   Chloride 108 98 - 111 mmol/L   CO2 27 22 - 32 mmol/L   Glucose, Bld 115 (H) 70 - 99 mg/dL   BUN 17 8 - 23 mg/dL   Creatinine, Ser 0.74 0.44 - 1.00 mg/dL   Calcium 8.5 (L) 8.9 - 10.3 mg/dL   GFR calc non Af Amer >60 >60 mL/min   GFR calc Af Amer >60 >60 mL/min    Comment: (NOTE) The eGFR has been calculated using the CKD EPI equation. This calculation has not been validated in all clinical situations. eGFR's persistently <60 mL/min signify possible Chronic  Kidney Disease.    Anion gap 5 5 - 15    Comment: Performed at [facility], [address] [place], [place] [address]  CBC     Status: Abnormal   Collection Time: 01/15/18  2:58 AM  Result Value Ref Range   WBC 11.1 (H) 4.0 - 10.5 K/uL   RBC 3.77 (L) 3.87 - 5.11 MIL/uL   Hemoglobin 11.5 (L) 12.0 - 15.0 g/dL   HCT 36.0 36.0 - 46.0 %   MCV 95.5 80.0 - 100.0 fL   MCH 30.5 26.0 - 34.0 pg   MCHC 31.9 30.0 - 36.0 g/dL   RDW 13.1 11.5 - 15.5 %   Platelets 163 150 - 400 K/uL   nRBC 0.0 0.0 - 0.2 %    Comment: Performed at [facility], [address] [place], [place] [address]  Troponin I - Once     Status: None   Collection Time: 01/15/18  2:58 AM  Result Value Ref Range   Troponin I <0.03 <0.03 ng/mL    Comment: Performed at [facility], [address], [place], [place] [address]   Dg Chest Portable 1 View  Result  Date: 01/15/2018 CLINICAL DATA:  Acute onset of midsternal chest pain, diaphoresis and nausea. EXAM: PORTABLE CHEST 1 VIEW COMPARISON:  Chest radiograph and CTA of the chest performed 12/14/2016 FINDINGS: The lungs are well-aerated. Minimal bibasilar atelectasis is noted. There is no evidence of pleural effusion or pneumothorax. The cardiomediastinal silhouette is normal in size. A pacemaker/AICD is noted overlying the left chest wall, with leads ending overlying the right atrium and right ventricle. No acute osseous abnormalities are seen. IMPRESSION: Minimal bibasilar atelectasis noted.  Lungs otherwise clear. Electronically Signed   By: Garald Balding M.D.   On: 01/15/2018 03:25    Pending Labs [employer] (From admission, onward)    Start     Ordered   Signed and Held  Creatinine, serum  (enoxaparin (LOVENOX)    CrCl >/= 30 ml/min)  Weekly,   R    Comments:  while on enoxaparin therapy    Signed and Held   Signed and Held  TSH  Add-on,   R     Signed and Held   Signed and Held  Hemoglobin A1c  Add-on,   R     Signed and Held   Signed and Held  Troponin I - Now Then Q6H  Now then every 6 hours,   R     Signed and Held          Vitals/Pain Today's Vitals   01/15/18 0600 01/15/18 0630 01/15/18 0700 01/15/18 0716  BP: 121/66 128/65 140/64   Pulse: 82 79 85   Resp:      Temp:      TempSrc:      SpO2: 94% 95% 95%   Weight:      Height:      PainSc:    0-No pain    Isolation Precautions No active isolations  Medications Medications - No data to display  Mobility walks

## 2018-01-15 NOTE — Care Management Note (Signed)
Case Management Note  Patient Details  Name: Alicia Patterson MRN: [id number] Date of Birth: Aug 13, 1930  Subjective/Objective:     From home alone.  Admitted with chest pain that started early this morning at 1am.  History of MI last December with AICD.  Went to [facility] for 8 weeks and "graduated".   Negative troponin's.  Daughter at bedside.  Patient is independent in all adl's.  Still drives during the day.  Her daughter will transport her if raining.  She denies difficulty obtaining medications at [facility]. Current with PCP and cardiologist.  Delila Spence daily, has been told she has CHF in the past and is now pre-diabetic.  She states she follows her heart healthy diet and watches her sugar intake.  Uses an exercise bike at home and a treadmill.  Declines and home health services.  Currently on room air. Will continue to follow as patient's  Progress.  No needs identified at this time.                 Action/Plan:   Expected Discharge Date:                  Expected Discharge Plan:  Home/Self Care  In-House Referral:     Discharge planning Services  CM Consult  Post Acute Care Choice:    Choice offered to:     DME Arranged:    DME Agency:     HH Arranged:    HH Agency:     Status of Service:  In process, will continue to follow  If discussed at Long Length of Stay Meetings, dates discussed:    Additional Comments:  Elza Rafter, RN 01/15/2018, 10:49 AM

## 2018-01-15 NOTE — ED Notes (Signed)
Dr Marcille Blanco to bedside, pt denies pain at this time

## 2018-01-15 NOTE — Progress Notes (Signed)
Patient admitted this morning.  Seen and examined by me.  States she is having continued chest "discomfort".  The chest discomfort is centrally located.  She did have have an episode where she felt like she was "punched in the chest"  and had associated shortness of breath, nausea and diaphoresis.  On exam, she has a regular rate and rhythm.  No murmurs.  She does have chest pain that is reproducible on palpation of the sternum.  -Awaiting cardiology consult -Trend troponins -Continue beta-blocker and ACE -Continue nitroglycerin as needed -Continue aspirin and Plavix  Hyman Bible, MD

## 2018-01-15 NOTE — H&P (Addendum)
Alicia Patterson is an 84 y.o. female.   Chief Complaint: Chest pain HPI: The patient with past medical history of coronary artery disease status post STEMI with subsequent PCI to LAD was into the emergency department complaining of chest pain.  The pain began while the patient was in bed.  She reports that it awoke her from sleep.  The pain was nonradiating and "hard" at first in her mid chest but then became sharp at times.  She became nauseous and diaphoretic with nonbloody nonbilious running.  She admits to feeling short of breath at the time especially as she tried to get up from bed.  Her pain resolved shortly after coming to the emergency department but she admits to feeling some pressure still.  She reports some palpitations and notes heart rates elevated from 137-156 last week while taking mild exercise.  Though the patient's cardiac enzymes were negative her past medical history of high risk coronary artery disease and ongoing chest pressure prompted the emergency department staff to call hospitalist service for admission.  Past Medical History:  Diagnosis Date  . Cancer ([place])   . Coronary artery disease   . GERD (gastroesophageal reflux disease)   . Glaucoma     Past Surgical History:  Procedure Laterality Date  . ABDOMINAL HYSTERECTOMY    . BREAST BIOPSY Left 05/22/2016   neg-ORGANIZING FAT NECROSIS. VASCULAR CALCS   . CHOLECYSTECTOMY    . LEFT HEART CATH AND CORONARY ANGIOGRAPHY N/A 12/15/2016   Procedure: LEFT HEART CATH AND CORONARY ANGIOGRAPHY;  Surgeon: Dionisio David, MD;  Location[facility];  Service: Cardiovascular;  Laterality: N/A;  . TONSILLECTOMY      Family History  Problem Relation Age of Onset  . Breast cancer Sister 10  . Breast cancer Other 8   Social History:  reports that she quit smoking about 40 years ago. She has never used smokeless tobacco. She reports that she does not drink alcohol or use drugs.  Allergies:  Allergies  Allergen  Reactions  . Amoxicillin-Pot Clavulanate     Other reaction(s): Unknown  Pt states she is not allergic to this medication  . Cefdinir     Other reaction(s): Other (See Comments)  . Gatifloxacin     Other reaction(s): Unknown Other reaction(s): Other (See Comments)  . Sulfa Antibiotics Rash  . Sulfasalazine Rash    Prior to Admission medications   Medication Sig Start Date End Date Taking? Authorizing Provider  aspirin EC 81 MG tablet Take 81 mg by mouth daily.   Yes [provider]  atorvastatin (LIPITOR) 40 MG tablet Take 1 tablet (40 mg total) by mouth daily at 6 PM. 12/15/16  Yes Epifanio Lesches, MD  brimonidine-timolol (COMBIGAN) 0.2-0.5 % ophthalmic solution Place 1 drop into both eyes every 12 (twelve) hours.   Yes [provider]  carvedilol (COREG) 6.25 MG tablet Take 6.25 mg by mouth 2 (two) times daily with a meal.  02/13/17  Yes [provider]  clopidogrel (PLAVIX) 75 MG tablet Take 75 mg by mouth daily.  02/13/17  Yes [provider]  dorzolamide (TRUSOPT) 2 % ophthalmic solution Place 1 drop into both eyes daily. 08/31/15  Yes [provider]  latanoprost (XALATAN) 0.005 % ophthalmic solution Place 1 drop into both eyes daily.   Yes [provider]  lisinopril (PRINIVIL,ZESTRIL) 5 MG tablet Take 5 mg by mouth daily.   Yes [provider]  nitroGLYCERIN (NITROSTAT) 0.3 MG SL tablet Place 1 tablet (  0.3 mg total) under the tongue every 5 (five) minutes as needed for chest pain. 12/15/16 01/15/18 Yes Epifanio Lesches, MD  pantoprazole (PROTONIX) 40 MG tablet Take 40 mg by mouth daily.  02/13/17  Yes [provider]  aspirin EC 325 MG EC tablet Take 1 tablet (325 mg total) by mouth daily. Patient not taking: Reported on 01/15/2018 12/16/16   Epifanio Lesches, MD  clobetasol ointment (TEMOVATE) 1.19 % Apply 1 application topically every other day. 11/06/16   [provider]  cyanocobalamin  500 MCG tablet Take 500 mcg by mouth daily.    [provider]  Glucosamine-Chondroit-Vit C-Mn (GLUCOSAMINE-CHONDROITIN MAX ST) CAPS Take by mouth.    [provider]  Multiple Vitamin (MULTI-VITAMINS) TABS Take by mouth.    [provider]  nitrofurantoin, macrocrystal-monohydrate, (MACROBID) 100 MG capsule Take by mouth daily. 01/15/17   [provider]     Results for orders placed or performed during the hospital encounter of 01/15/18 (from the past 48 hour(s))  Basic metabolic panel     Status: Abnormal   Collection Time: 01/15/18  2:58 AM  Result Value Ref Range   Sodium 140 135 - 145 mmol/L   Potassium 3.7 3.5 - 5.1 mmol/L   Chloride 108 98 - 111 mmol/L   CO2 27 22 - 32 mmol/L   Glucose, Bld 115 (H) 70 - 99 mg/dL   BUN 17 8 - 23 mg/dL   Creatinine, Ser 0.74 0.44 - 1.00 mg/dL   Calcium 8.5 (L) 8.9 - 10.3 mg/dL   GFR calc non Af Amer >60 >60 mL/min   GFR calc Af Amer >60 >60 mL/min    Comment: (NOTE) The eGFR has been calculated using the CKD EPI equation. This calculation has not been validated in all clinical situations. eGFR's persistently <60 mL/min signify possible Chronic Kidney Disease.    Anion gap 5 5 - 15    Comment: Performed at [facility], [address] [place], [place] [address]  CBC     Status: Abnormal   Collection Time: 01/15/18  2:58 AM  Result Value Ref Range   WBC 11.1 (H) 4.0 - 10.5 K/uL   RBC 3.77 (L) 3.87 - 5.11 MIL/uL   Hemoglobin 11.5 (L) 12.0 - 15.0 g/dL   HCT 36.0 36.0 - 46.0 %   MCV 95.5 80.0 - 100.0 fL   MCH 30.5 26.0 - 34.0 pg   MCHC 31.9 30.0 - 36.0 g/dL   RDW 13.1 11.5 - 15.5 %   Platelets 163 150 - 400 K/uL   nRBC 0.0 0.0 - 0.2 %    Comment: Performed at [facility], [address] [place], [place] [address]  Troponin I - Once     Status: None   Collection Time: 01/15/18  2:58 AM  Result Value Ref Range   Troponin I <0.03 <0.03 ng/mL    Comment: Performed at [facility], [address], [place],  [address]   Dg Chest Portable 1 View  Result Date: 01/15/2018 CLINICAL DATA:  Acute onset of midsternal chest pain, diaphoresis and nausea. EXAM: PORTABLE CHEST 1 VIEW COMPARISON:  Chest radiograph and CTA of the chest performed 12/14/2016 FINDINGS: The lungs are well-aerated. Minimal bibasilar atelectasis is noted. There is no evidence of pleural effusion or pneumothorax. The cardiomediastinal silhouette is normal in size. A pacemaker/AICD is noted overlying the left chest wall, with leads ending overlying the right atrium and right ventricle. No acute osseous abnormalities are seen.  IMPRESSION: Minimal bibasilar atelectasis noted.  Lungs otherwise clear. Electronically Signed   By: Garald Balding M.D.   On: 01/15/2018 03:25    Review of Systems  Constitutional: Positive for diaphoresis. Negative for chills and fever.  HENT: Negative for sore throat and tinnitus.   Eyes: Negative for blurred vision and redness.  Respiratory: Negative for cough and shortness of breath.   Cardiovascular: Positive for chest pain and palpitations. Negative for orthopnea and PND.  Gastrointestinal: Positive for nausea and vomiting. Negative for abdominal pain and diarrhea.  Genitourinary: Negative for dysuria, frequency and urgency.  Musculoskeletal: Negative for joint pain and myalgias.  Skin: Negative for rash.       No lesions  Neurological: Negative for speech change, focal weakness and weakness.  Endo/Heme/Allergies: Does not bruise/bleed easily.       No temperature intolerance  Psychiatric/Behavioral: Negative for depression and suicidal ideas.    Blood pressure 140/64, pulse 85, temperature 97.9 F (36.6 C), temperature source Oral, resp. rate 19, height _0  (1.6 m), weight 59.9 kg, SpO2 95 %. Physical Exam  Vitals reviewed. Constitutional: She is oriented to person, place, and time. She appears well-developed and well-nourished. No distress.  HENT:   Head: Normocephalic and atraumatic.  Mouth/Throat: Oropharynx is clear and moist.  Eyes: Pupils are equal, round, and reactive to light. Conjunctivae and EOM are normal. No scleral icterus.  Neck: Normal range of motion. Neck supple. No JVD present. No tracheal deviation present. No thyromegaly present.  Cardiovascular: Normal rate, regular rhythm and normal heart sounds. Exam reveals no gallop and no friction rub.  No murmur heard. Respiratory: Effort normal and breath sounds normal.  GI: Soft. Bowel sounds are normal. She exhibits no distension. There is no tenderness.  Genitourinary:  Genitourinary Comments: Deferred  Musculoskeletal: Normal range of motion. She exhibits no edema.  Lymphadenopathy:    She has no cervical adenopathy.  Neurological: She is alert and oriented to person, place, and time. No cranial nerve deficit. She exhibits normal muscle tone.  Skin: Skin is warm and dry. No rash noted. No erythema.  Psychiatric: She has a normal mood and affect. Her behavior is normal. Judgment and thought content normal.     Assessment/Plan This is an 84 year old female admitted for chest pain. 1.  Chest pain: Significantly decreased but ongoing substernal pressure that does not radiate.  No arrhythmias detected in the emergency department.  EKG shows no signs of ischemia.  Unclear if AICD fired although the patient reports interrogation of the device 48 hours ago showing no arrhythmias or shocks.  Continue to follow cardiac biomarkers.  Monitor telemetry.  Consult cardiology. 2.  CAD: Continue aspirin and Plavix.  Nitroglycerin as needed for chest pain 3.  Hypertension: Borderline elevated; continue lisinopril and carvedilol 4.  Hyperlipidemia: Continue statin therapy 5.  DVT prophylaxis: Lovenox 6.  GI prophylaxis: PPI per home regimen The patient is a full code.  Time spent on admission orders and patient care approximately 45 minutes  Harrie Foreman, MD 01/15/2018, 7:45  AM

## 2018-01-15 NOTE — ED Provider Notes (Signed)
[facility] Emergency Department Provider Note   First MD Initiated Contact with Patient 01/15/18 0308     (approximate)  I have reviewed the triage vital signs and the nursing notes.   HISTORY  Chief Complaint Chest Pain    HPI Alicia Patterson is a 84 y.o. female with below list of chronic medical conditions including myocardial infarction with indwelling AICD congestive heart failure presents to the emergency department with acute onset of central chest pain which patient states was 10 out of 10 that awoke her from sleep.  Patient also admits to being diaphoretic with nausea at the time.  Patient states that symptoms has since resolved and is concerned that her AICD may have fired.  Patient states that the pain is worse than when she had her myocardial infarction.   Past Medical History:  Diagnosis Date  . Cancer ([place])   . Coronary artery disease   . GERD (gastroesophageal reflux disease)   . Glaucoma     Patient Active Problem List   Diagnosis Date Noted  . Glaucoma 02/16/2017  . Glaucoma of both eyes 02/16/2017  . S/P drug eluting coronary stent placement 01/07/2017  . ICD (implantable cardioverter-defibrillator), dual, in situ 12/23/2016  . Acute systolic CHF (congestive heart failure) ([place]) 12/16/2016  . CAD (coronary artery disease) 12/16/2016  . NSTEMI (non-ST elevated myocardial infarction) ([place]) 12/15/2016  . Recurrent UTI 03/20/2015  . Urinary tract infection 03/20/2015  . Small bowel obstruction ([place]) 12/29/2014  . Esophageal reflux 11/10/2013  . C2 cervical fracture ([place]) 06/05/2011  . Fracture, metacarpal, neck 05/30/2011    Past Surgical History:  Procedure Laterality Date  . ABDOMINAL HYSTERECTOMY    . BREAST BIOPSY Left 05/22/2016   neg-ORGANIZING FAT NECROSIS. VASCULAR CALCS   . CHOLECYSTECTOMY    . LEFT HEART CATH AND CORONARY ANGIOGRAPHY N/A 12/15/2016   Procedure: LEFT HEART CATH AND CORONARY ANGIOGRAPHY;  Surgeon: Dionisio David, MD;  Location: [facility];  Service: Cardiovascular;  Laterality: N/A;  . TONSILLECTOMY      Prior to Admission medications   Medication Sig Start Date End Date Taking? Authorizing Provider  aspirin EC 325 MG EC tablet Take 1 tablet (325 mg total) by mouth daily. 12/16/16   Epifanio Lesches, MD  atorvastatin (LIPITOR) 40 MG tablet Take 1 tablet (40 mg total) by mouth daily at 6 PM. 12/15/16   Epifanio Lesches, MD  brimonidine-timolol (COMBIGAN) 0.2-0.5 % ophthalmic solution Place 1 drop into both eyes every 12 (twelve) hours.    [provider]  carvedilol (COREG) 3.125 MG tablet  02/13/17   [provider]  clobetasol ointment (TEMOVATE) 6.06 % Apply 1 application topically every other day. 11/06/16   [provider]  clopidogrel (PLAVIX) 75 MG tablet  02/13/17   [provider]  cyanocobalamin 500 MCG tablet Take 500 mcg by mouth daily.    [provider]  dorzolamide (TRUSOPT) 2 % ophthalmic solution Place 1 drop into both eyes daily. 08/31/15   [provider]  Glucosamine-Chondroit-Vit C-Mn (GLUCOSAMINE-CHONDROITIN MAX ST) CAPS Take by mouth.    [provider]  latanoprost (XALATAN) 0.005 % ophthalmic solution Place 1 drop into both eyes daily.    [provider]  Multiple Vitamin (MULTI-VITAMINS) TABS Take by mouth.    [provider]  nitrofurantoin, macrocrystal-monohydrate, (MACROBID) 100 MG capsule Take by mouth daily. 01/15/17   [provider]  nitroGLYCERIN (NITROSTAT) 0.3 MG SL tablet Place 1 tablet (0.3 mg  total) under the tongue every 5 (five) minutes as needed for chest pain. 12/15/16 12/15/17  Epifanio Lesches, MD  omeprazole (PRILOSEC) 20 MG capsule Take 1 capsule by mouth daily. 08/18/14   [provider]  pantoprazole (PROTONIX) 40 MG tablet  02/13/17   [provider]    Allergies Amoxicillin-pot clavulanate; Cefdinir; Gatifloxacin;  Sulfa antibiotics; and Sulfasalazine  Family History  Problem Relation Age of Onset  . Breast cancer Sister 53  . Breast cancer Other 105    Social History Social History   Tobacco Use  . Smoking status: Former Smoker    Last attempt to quit: 1979    Years since quitting: 40.8  . Smokeless tobacco: Never Used  Substance Use Topics  . Alcohol use: No  . Drug use: No    Review of Systems Constitutional: No fever/chills Eyes: No visual changes. ENT: No sore throat. Cardiovascular: Positive for chest pain. Respiratory: Denies shortness of breath. Gastrointestinal: No abdominal pain.  No nausea, no vomiting.  No diarrhea.  No constipation. Genitourinary: Negative for dysuria. Musculoskeletal: Negative for neck pain.  Negative for back pain. Integumentary: Negative for rash. Neurological: Negative for headaches, focal weakness or numbness.  ____________________________________________   PHYSICAL EXAM:  VITAL SIGNS: ED Triage Vitals  Enc Vitals Group     BP 01/15/18 0232 (!) 157/57     Pulse Rate 01/15/18 0232 74     Resp 01/15/18 0232 18     Temp 01/15/18 0232 97.9 F (36.6 C)     Temp Source 01/15/18 0232 Oral     SpO2 01/15/18 0232 98 %     Weight 01/15/18 0230 59.9 kg (132 lb)     Height 01/15/18 0230 1.6 m (5\' 3" )     Head Circumference --      Peak Flow --      Pain Score 01/15/18 0230 3     Pain Loc --      Pain Edu? --      Excl. in [place]? --     Constitutional: Alert and oriented. Well appearing and in no acute distress. Eyes: Conjunctivae are normal.  Head: Atraumatic. Mouth/Throat: Mucous membranes are moist.  Oropharynx non-erythematous. Neck: No stridor.  Cardiovascular: Normal rate, regular rhythm. Good peripheral circulation. Grossly normal heart sounds. Respiratory: Normal respiratory effort.  No retractions. Lungs CTAB. Gastrointestinal: Soft and nontender. No distention.  Musculoskeletal: No lower extremity tenderness nor edema. No gross  deformities of extremities. Neurologic:  Normal speech and language. No gross focal neurologic deficits are appreciated.  Skin:  Skin is warm, dry and intact. No rash noted. Psychiatric: Mood and affect are normal. Speech and behavior are normal.  ____________________________________________   LABS (all labs ordered are listed, but only abnormal results are displayed)  Labs Reviewed  BASIC METABOLIC PANEL - Abnormal; Notable for the following components:      Result Value   Glucose, Bld 115 (*)    Calcium 8.5 (*)    All other components within normal limits  CBC - Abnormal; Notable for the following components:   WBC 11.1 (*)    RBC 3.77 (*)    Hemoglobin 11.5 (*)    All other components within normal limits  TROPONIN I   ____________________________________________  EKG  ED ECG REPORT I, Holley N , the attending physician, personally viewed and interpreted this ECG.   Date: 01/15/2018  EKG Time: 2:32 AM  Rate: 71  Rhythm: Normal sinus rhythm  Axis: Normal  Intervals: Normal  ST&T  Change: Anterior ST segment depression  ____________________________________________  RADIOLOGY I, [place], personally viewed and evaluated these images (plain radiographs) as part of my medical decision making, as well as reviewing the written report by the radiologist.  ED MD interpretation: Minimal bibasilar atelectasis noted per radiologist chest x-ray  Official radiology report(s): Dg Chest Portable 1 View  Result Date: 01/15/2018 CLINICAL DATA:  Acute onset of midsternal chest pain, diaphoresis and nausea. EXAM: PORTABLE CHEST 1 VIEW COMPARISON:  Chest radiograph and CTA of the chest performed 12/14/2016 FINDINGS: The lungs are well-aerated. Minimal bibasilar atelectasis is noted. There is no evidence of pleural effusion or pneumothorax. The cardiomediastinal silhouette is normal in size. A pacemaker/AICD is noted overlying the left chest wall, with leads ending  overlying the right atrium and right ventricle. No acute osseous abnormalities are seen. IMPRESSION: Minimal bibasilar atelectasis noted.  Lungs otherwise clear. Electronically Signed   By: Garald Balding M.D.   On: 01/15/2018 03:25      Procedures   ____________________________________________   INITIAL IMPRESSION / ASSESSMENT AND PLAN / ED COURSE  As part of my medical decision making, I reviewed the following data within the electronic MEDICAL RECORD NUMBER   84 year old female presenting with above-stated history and physical exam secondary to chest pain.  Concern for possible CAD and a such EKG performed which revealed no evidence of infarction.  Laboratory data revealed a troponin that is negative.  I suspect that the patient's AICD may have discharged and as such concern for possible arrhythmia which may have occurred at home.  Patient discussed with Dr. Marcille Blanco for hospital admission for further evaluation and management. ____________________________________________  FINAL CLINICAL IMPRESSION(S) / ED DIAGNOSES  Final diagnoses:  Chest pain, unspecified type     MEDICATIONS GIVEN DURING THIS VISIT:  Medications - No data to display   ED Discharge Orders    None       Note:  This document was prepared using Dragon voice recognition software and may include unintentional dictation errors.    Gregor Hams, MD 01/15/18 [phone]

## 2018-01-16 DIAGNOSIS — R0789 Other chest pain: Secondary | ICD-10-CM | POA: Diagnosis not present

## 2018-01-16 DIAGNOSIS — R079 Chest pain, unspecified: Secondary | ICD-10-CM | POA: Diagnosis not present

## 2018-01-16 LAB — CBC
HCT: 33.7 % — ABNORMAL LOW (ref 36.0–46.0)
Hemoglobin: 10.9 g/dL — ABNORMAL LOW (ref 12.0–15.0)
MCH: 30.4 pg (ref 26.0–34.0)
MCHC: 32.3 g/dL (ref 30.0–36.0)
MCV: 94.1 fL (ref 80.0–100.0)
NRBC: 0 % (ref 0.0–0.2)
PLATELETS: 139 10*3/uL — AB (ref 150–400)
RBC: 3.58 MIL/uL — ABNORMAL LOW (ref 3.87–5.11)
RDW: 13.1 % (ref 11.5–15.5)
WBC: 3.9 10*3/uL — AB (ref 4.0–10.5)

## 2018-01-16 LAB — BASIC METABOLIC PANEL
ANION GAP: 5 (ref 5–15)
BUN: 14 mg/dL (ref 8–23)
CALCIUM: 8.2 mg/dL — AB (ref 8.9–10.3)
CO2: 26 mmol/L (ref 22–32)
Chloride: 111 mmol/L (ref 98–111)
Creatinine, Ser: 0.62 mg/dL (ref 0.44–1.00)
GFR calc non Af Amer: >60 mL/min (ref >60)
Glucose, Bld: 103 mg/dL — ABNORMAL HIGH (ref 70–99)
Potassium: 3.3 mmol/L — ABNORMAL LOW (ref 3.5–5.1)
Sodium: 142 mmol/L (ref 135–145)

## 2018-01-16 MED ORDER — POTASSIUM CHLORIDE CRYS ER 20 MEQ PO TBCR: 40.0000 meq | EXTENDED_RELEASE_TABLET | Freq: Once | ORAL | Status: DC

## 2018-01-16 NOTE — Progress Notes (Signed)
RN removed IV. Patient left in private vehicle.  Phillis Knack, RN

## 2018-01-16 NOTE — Progress Notes (Signed)
Patient stated she no longer takes Nitrofurantoin and wants it removed from her med list.  Phillis Knack, RN

## 2018-01-16 NOTE — Discharge Instructions (Signed)
Follow-up with primary care physician in 3 to 5 days Follow-up with [facility] cardiology Dr. Saralyn Pilar in a week

## 2018-01-16 NOTE — Discharge Summary (Signed)
[facility] [place] NAME: Alicia Patterson    MR#:  [id number]  DATE OF BIRTH:  Jun 03, 1930  DATE OF ADMISSION:  01/15/2018 ADMITTING PHYSICIAN: Harrie Foreman, MD  DATE OF DISCHARGE:  01/16/18 PRIMARY CARE PHYSICIAN: Isaias Cowman, MD    ADMISSION DIAGNOSIS:  Chest pain, unspecified type [R07.9]  DISCHARGE DIAGNOSIS:  Active Problems:   Chest pain   SECONDARY DIAGNOSIS:   Past Medical History:  Diagnosis Date  . Cancer ([place])   . Coronary artery disease   . GERD (gastroesophageal reflux disease)   . Glaucoma     HOSPITAL COURSE:   HPI: The patient with past medical history of coronary artery disease status post STEMI with subsequent PCI to LAD was into the emergency department complaining of chest pain.  The pain began while the patient was in bed.  She reports that it awoke her from sleep.  The pain was nonradiating and "hard" at first in her mid chest but then became sharp at times.  She became nauseous and diaphoretic with nonbloody nonbilious running.  She admits to feeling short of breath at the time especially as she tried to get up from bed.  Her pain resolved shortly after coming to the emergency department but she admits to feeling some pressure still.  She reports some palpitations and notes heart rates elevated from 137-156 last week while taking mild exercise.  Though the patient's cardiac enzymes were negative her past medical history of high risk coronary artery disease and ongoing chest pressure prompted the emergency department staff to call hospitalist service for admission.  1.  Chest pain: Atypical Acute MI ruled out with negative troponins x3  No arrhythmias detected in the emergency department.  EKG shows no signs of ischemia.   Patient seen by cardiology Dr. Ubaldo Glassing recommending to continue home dose aspirin Plavix lisinopril and Coreg 3.125 mg.  But patient prefers taking previous dose 6.25 mg of Coreg  as her blood pressure usually runs at around 150's at home Outpatient follow-up with Dr. Saralyn Pilar in a week Okay to discharge patient from cardiology standpoint patient is agreeable  2.  CAD: Continue aspirin and Plavix.  Nitroglycerin as needed for chest pain  3.  Hypertension: Borderline elevated; continue lisinopril and carvedilol  4.  Hyperlipidemia: Continue statin therapy  5.  Hypokalemia repleted potassium   DVT prophylaxis: Lovenox  6.  GI prophylaxis: PPI per home regimen DISCHARGE CONDITIONS:  stable  CONSULTS OBTAINED:  Treatment Team:  Teodoro Spray, MD   PROCEDURES none   DRUG ALLERGIES:   Allergies  Allergen Reactions  . Amoxicillin-Pot Clavulanate     Other reaction(s): Unknown  Pt states she is not allergic to this medication  . Cefdinir     Other reaction(s): Other (See Comments)  . Gatifloxacin     Other reaction(s): Unknown Other reaction(s): Other (See Comments)  . Sulfa Antibiotics Rash  . Sulfasalazine Rash    DISCHARGE MEDICATIONS:   Allergies as of 01/16/2018      Reactions   Amoxicillin-pot Clavulanate    Other reaction(s): Unknown Pt states she is not allergic to this medication   Cefdinir    Other reaction(s): Other (See Comments)   Gatifloxacin    Other reaction(s): Unknown Other reaction(s): Other (See Comments)   Sulfa Antibiotics Rash   Sulfasalazine Rash      Medication List    STOP taking these medications   nitrofurantoin (macrocrystal-monohydrate) 100 MG capsule Commonly known  as:  MACROBID     TAKE these medications   aspirin EC 81 MG tablet Take 81 mg by mouth daily. What changed:  Another medication with the same name was removed. Continue taking this medication, and follow the directions you see here.   atorvastatin 40 MG tablet Commonly known as:  LIPITOR Take 1 tablet (40 mg total) by mouth daily at 6 PM.   brimonidine-timolol 0.2-0.5 % ophthalmic solution Commonly known as:  COMBIGAN Place 1 drop  into both eyes every 12 (twelve) hours.   carvedilol 6.25 MG tablet Commonly known as:  COREG Take 6.25 mg by mouth 2 (two) times daily with a meal.   clobetasol ointment 0.05 % Commonly known as:  TEMOVATE Apply 1 application topically every other day.   clopidogrel 75 MG tablet Commonly known as:  PLAVIX Take 75 mg by mouth daily.   dorzolamide 2 % ophthalmic solution Commonly known as:  TRUSOPT Place 1 drop into both eyes daily.   GLUCOSAMINE-CHONDROITIN MAX ST Caps Take by mouth.   latanoprost 0.005 % ophthalmic solution Commonly known as:  XALATAN Place 1 drop into both eyes daily.   lisinopril 5 MG tablet Commonly known as:  PRINIVIL,ZESTRIL Take 5 mg by mouth daily.   MULTI-VITAMINS Tabs Take by mouth.   nitroGLYCERIN 0.3 MG SL tablet Commonly known as:  NITROSTAT Place 1 tablet (0.3 mg total) under the tongue every 5 (five) minutes as needed for chest pain.   pantoprazole 40 MG tablet Commonly known as:  PROTONIX Take 40 mg by mouth daily.   vitamin B-12 500 MCG tablet Commonly known as:  CYANOCOBALAMIN Take 500 mcg by mouth daily.        DISCHARGE INSTRUCTIONS:  Follow-up with primary care physician in 3 to 5 days Follow-up with [facility] cardiology Dr. Saralyn Pilar in a week   DIET:  Cardiac diet  DISCHARGE CONDITION:  Stable  ACTIVITY:  Activity as tolerated  OXYGEN:  Home Oxygen: No.   Oxygen Delivery: room air  DISCHARGE LOCATION:  home   If you experience worsening of your admission symptoms, develop shortness of breath, life threatening emergency, suicidal or homicidal thoughts you must seek medical attention immediately by calling 911 or calling your MD immediately  if symptoms less severe.  You Must read complete instructions/literature along with all the possible adverse reactions/side effects for all the Medicines you take and that have been prescribed to you. Take any new Medicines after you have completely understood and accpet all  the possible adverse reactions/side effects.   Please note  You were cared for by a hospitalist during your hospital stay. If you have any questions about your discharge medications or the care you received while you were in the hospital after you are discharged, you can call the unit and asked to speak with the hospitalist on call if the hospitalist that took care of you is not available. Once you are discharged, your primary care physician will handle any further medical issues. Please note that NO REFILLS for any discharge medications will be authorized once you are discharged, as it is imperative that you return to your primary care physician (or establish a relationship with a primary care physician if you do not have one) for your aftercare needs so that they can reassess your need for medications and monitor your lab values.     Today  Chief Complaint  Patient presents with  . Chest Pain   Patient is resting comfortably.  Denies any chest pain  or shortness of breath.  Feels comfortable to go home.  Okay to discharge patient from cardiology standpoint  ROS:  CONSTITUTIONAL: Denies fevers, chills. Denies any fatigue, weakness.  EYES: Denies blurry vision, double vision, eye pain. EARS, NOSE, THROAT: Denies tinnitus, ear pain, hearing loss. RESPIRATORY: Denies cough, wheeze, shortness of breath.  CARDIOVASCULAR: Denies chest pain, palpitations, edema.  GASTROINTESTINAL: Denies nausea, vomiting, diarrhea, abdominal pain. Denies bright red blood per rectum. GENITOURINARY: Denies dysuria, hematuria. ENDOCRINE: Denies nocturia or thyroid problems. HEMATOLOGIC AND LYMPHATIC: Denies easy bruising or bleeding. SKIN: Denies rash or lesion. MUSCULOSKELETAL: Denies pain in neck, back, shoulder, knees, hips or arthritic symptoms.  NEUROLOGIC: Denies paralysis, paresthesias.  PSYCHIATRIC: Denies anxiety or depressive symptoms.   VITAL SIGNS:  Blood pressure (!) 151/52, pulse 64, temperature  98.3 F (36.8 C), temperature source Oral, resp. rate 18, height 5' 3.5" (1.613 m), weight 60.8 kg, SpO2 94 %.  I/O:    Intake/Output Summary (Last 24 hours) at 01/16/2018 1138 Last data filed at 01/16/2018 0505 Gross per 24 hour  Intake 240 ml  Output 400 ml  Net -160 ml    PHYSICAL EXAMINATION:  GENERAL:  84 y.o.-year-old patient lying in the bed with no acute distress.  EYES: Pupils equal, round, reactive to light and accommodation. No scleral icterus. Extraocular muscles intact.  HEENT: Head atraumatic, normocephalic. Oropharynx and nasopharynx clear.  NECK:  Supple, no jugular venous distention. No thyroid enlargement, no tenderness.  LUNGS: Normal breath sounds bilaterally, no wheezing, rales,rhonchi or crepitation. No use of accessory muscles of respiration.  CARDIOVASCULAR: S1, S2 normal. No murmurs, rubs, or gallops.  ABDOMEN: Soft, non-tender, non-distended. Bowel sounds present. No organomegaly or mass.  EXTREMITIES: No pedal edema, cyanosis, or clubbing.  NEUROLOGIC: Cranial nerves II through XII are intact. Muscle strength 5/5 in all extremities. Sensation intact. Gait not checked.  PSYCHIATRIC: The patient is alert and oriented x 3.  SKIN: No obvious rash, lesion, or ulcer.   DATA REVIEW:   CBC Recent Labs  Lab 01/16/18 0426  WBC 3.9*  HGB 10.9*  HCT 33.7*  PLT 139*    Chemistries  Recent Labs  Lab 01/16/18 0426  NA 142  K 3.3*  CL 111  CO2 26  GLUCOSE 103*  BUN 14  CREATININE 0.62  CALCIUM 8.2*    Cardiac Enzymes Recent Labs  Lab 01/15/18 2012  TROPONINI <0.03    Microbiology Results  Results for orders placed or performed during the hospital encounter of 12/14/16  MRSA PCR Screening     Status: None   Collection Time: 12/15/16  3:22 AM  Result Value Ref Range Status   MRSA by PCR NEGATIVE NEGATIVE Final    Comment:        The GeneXpert MRSA Assay (FDA approved for NASAL specimens only), is one component of a comprehensive MRSA  colonization surveillance program. It is not intended to diagnose MRSA infection nor to guide or monitor treatment for MRSA infections.     RADIOLOGY:  Dg Chest Portable 1 View  Result Date: 01/15/2018 CLINICAL DATA:  Acute onset of midsternal chest pain, diaphoresis and nausea. EXAM: PORTABLE CHEST 1 VIEW COMPARISON:  Chest radiograph and CTA of the chest performed 12/14/2016 FINDINGS: The lungs are well-aerated. Minimal bibasilar atelectasis is noted. There is no evidence of pleural effusion or pneumothorax. The cardiomediastinal silhouette is normal in size. A pacemaker/AICD is noted overlying the left chest wall, with leads ending overlying the right atrium and right ventricle. No acute osseous abnormalities are  seen. IMPRESSION: Minimal bibasilar atelectasis noted.  Lungs otherwise clear. Electronically Signed   By: Garald Balding M.D.   On: 01/15/2018 03:25    EKG:   Orders placed or performed during the hospital encounter of 01/15/18  . EKG 12-Lead  . EKG 12-Lead  . ED EKG  . ED EKG  . ED EKG  . ED EKG      Management plans discussed with the patient, family and they are in agreement.  CODE STATUS:     Code Status Orders  (From admission, onward)         Start     Ordered   01/15/18 0827  Full code  Continuous     01/15/18 0826        Code Status History    Date Active Date Inactive Code Status Order ID Comments User Context   12/15/2016 0324 12/16/2016 0139 Full Code [id number]  Gorden Harms, MD Inpatient   12/29/2014 2334 01/01/2015 1457 Partial Code [id number]  Leonie Green, MD Inpatient    Advance Directive Documentation     Most Recent Value  Type of Advance Directive  Healthcare Power of [place], Living will  Pre-existing out of facility DNR order (yellow form or pink MOST form)  -  "MOST" Form in Place?  -      TOTAL TIME TAKING CARE OF THIS PATIENT: 42  minutes.   Note: This dictation was prepared with Dragon dictation along with  smaller phrase technology. Any transcriptional errors that result from this process are unintentional.   @MEC @  on 01/16/2018 at 11:38 AM  Between 7am to 6pm - Pager - [phone]  After 6pm go to [email or web address] - password [email or web address] [facility]  [facility]  Office  [phone]  CC: Primary care physician; Isaias Cowman, MD   [phone]

## 2018-03-01 IMAGING — US US BREAST*L* LIMITED INC AXILLA
1 series · 14 of 14 positions shown · non-contrast
Comparison: none

ACR Breast Density Category c: The breast tissue is heterogeneously
dense, which may obscure small masses.

[Series 1: us breast*left* limited inc axilla · 0.07mm/px · 14 of 14 slices shown]
[im 1/14]
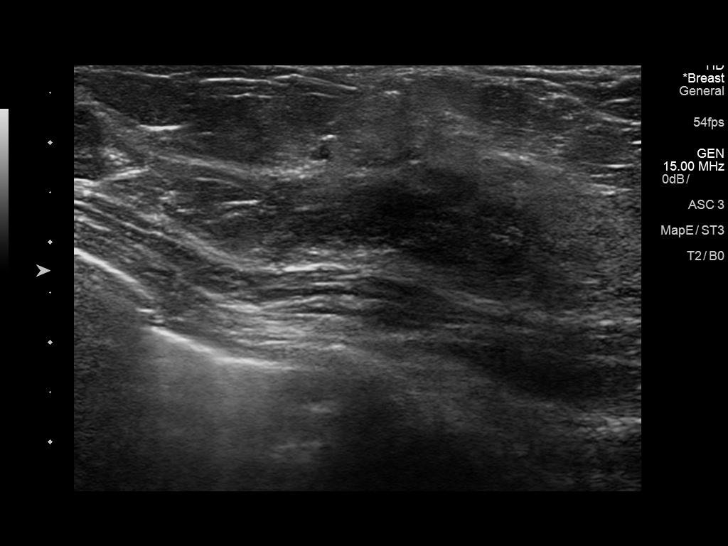
[im 2/14]
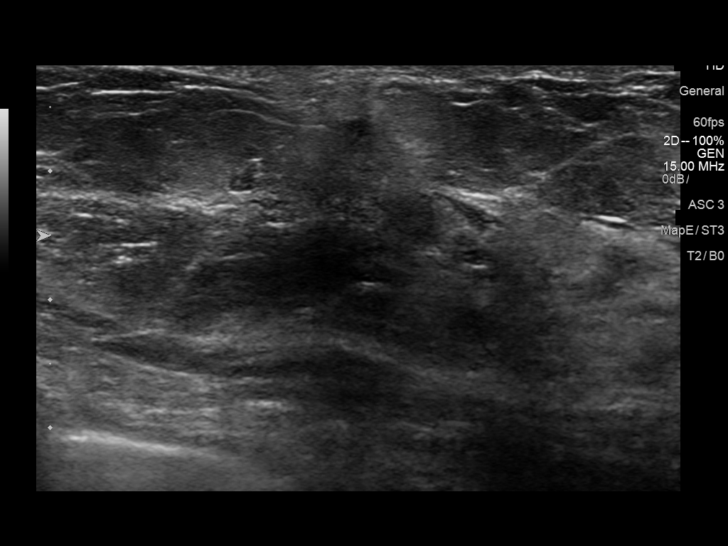
[im 3/14]
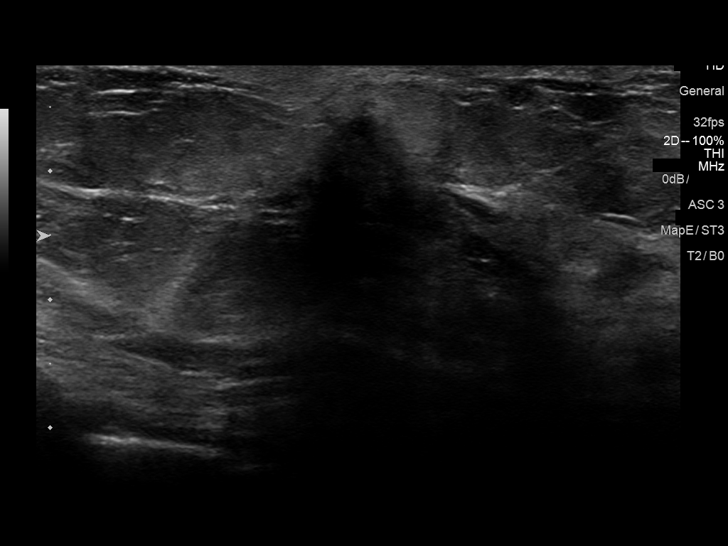
[im 4/14]
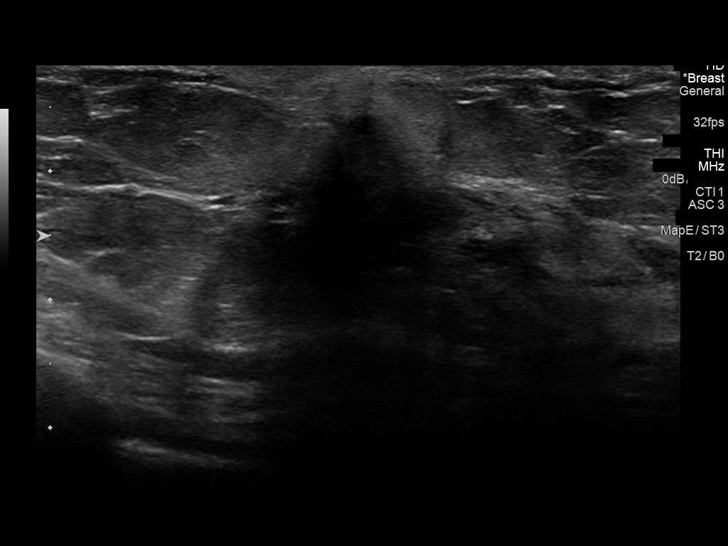
[im 5/14]
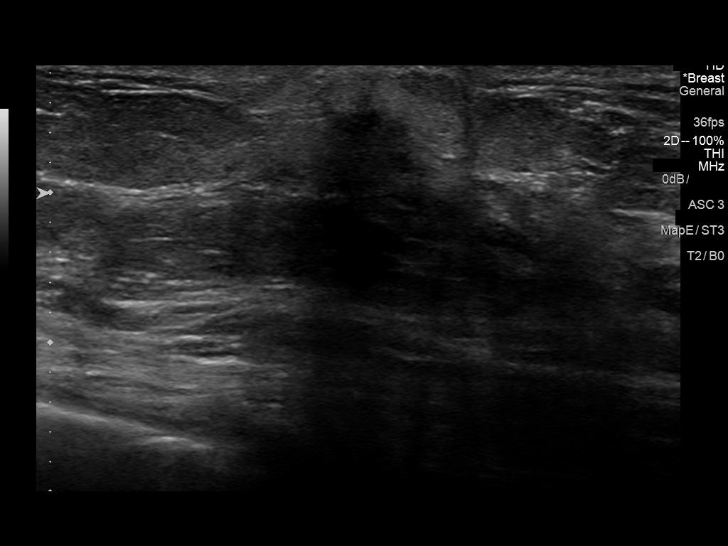
[im 6/14]
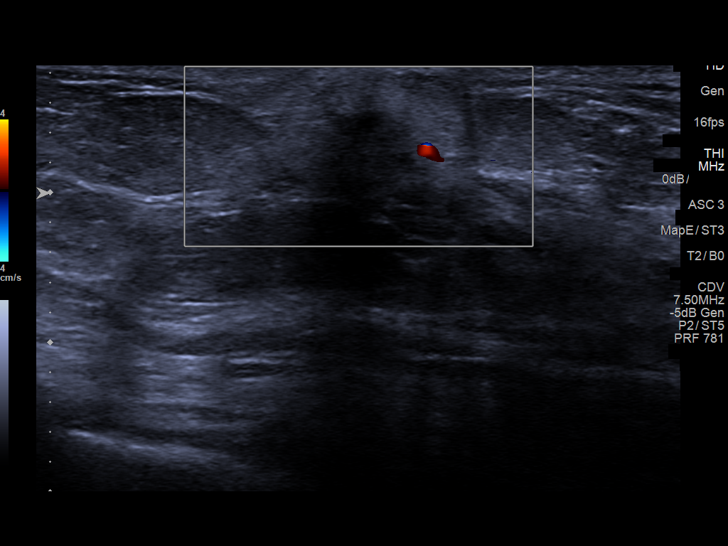
[im 7/14]
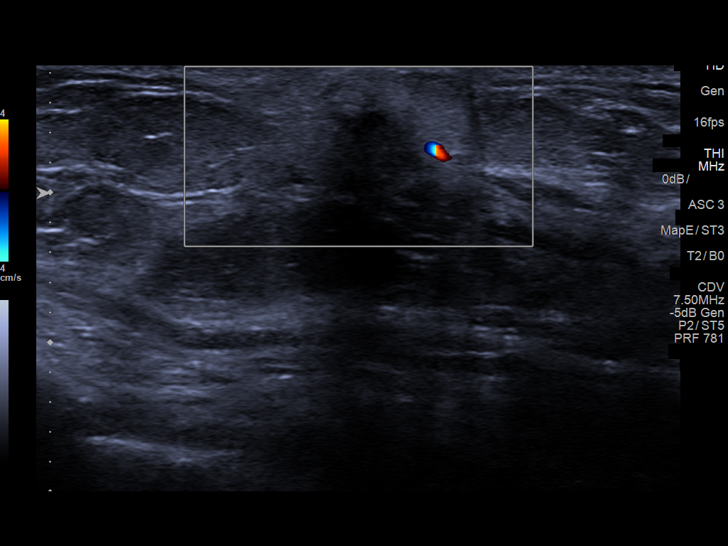
[im 8/14]
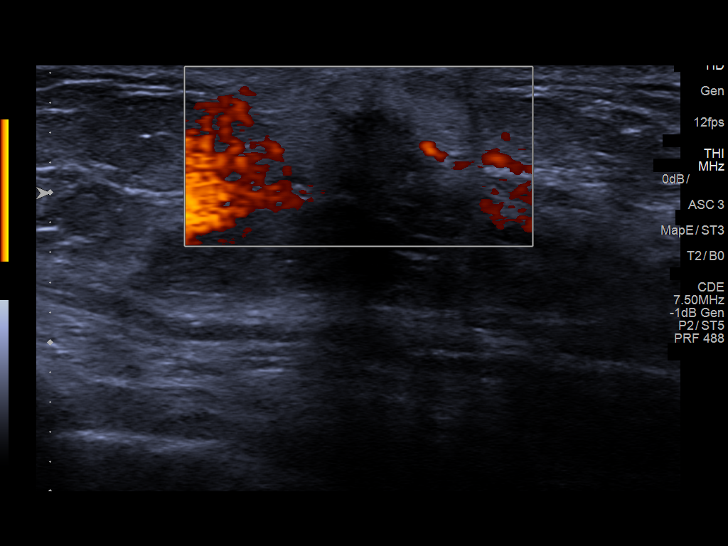
[im 9/14]
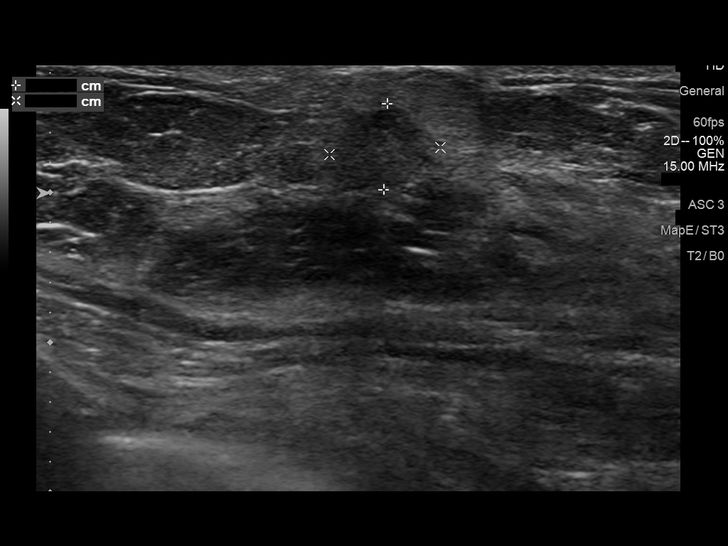
[im 10/14]
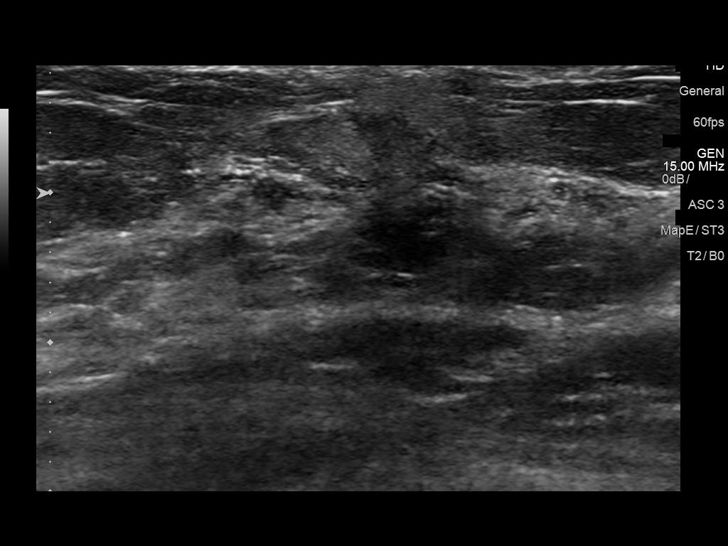
[im 11/14]
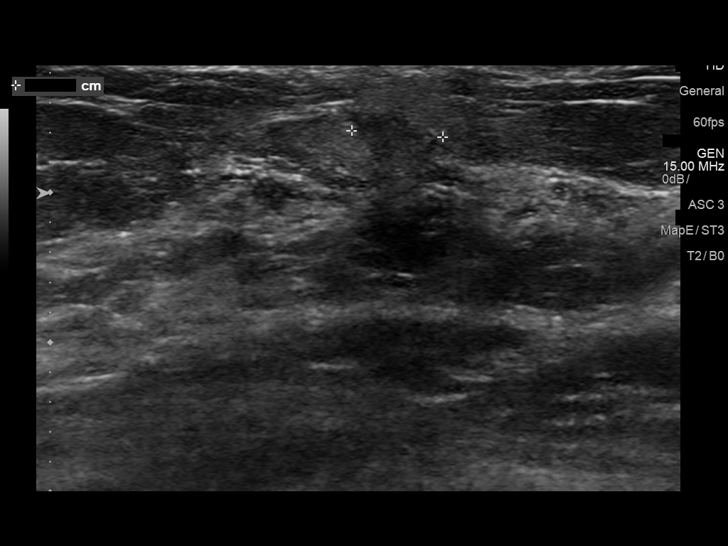
[im 12/14]
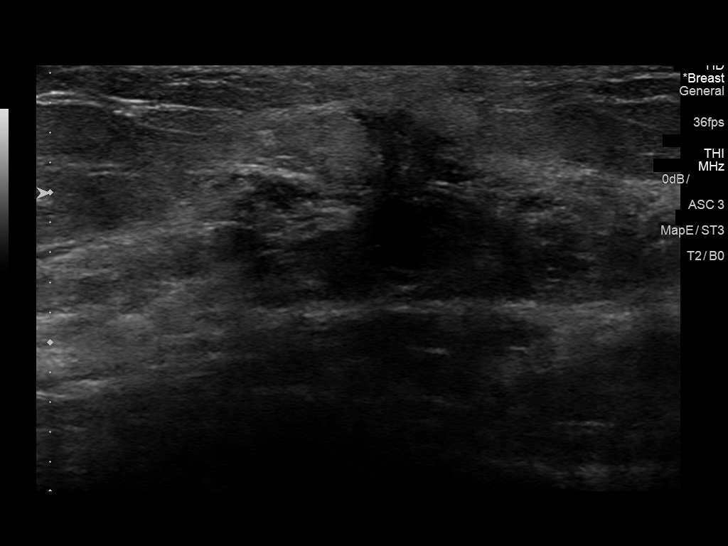
[im 13/14]
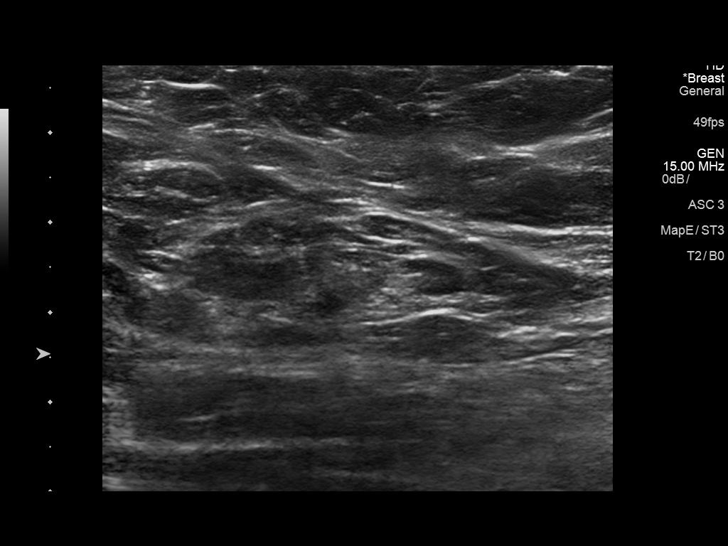
[im 14/14]
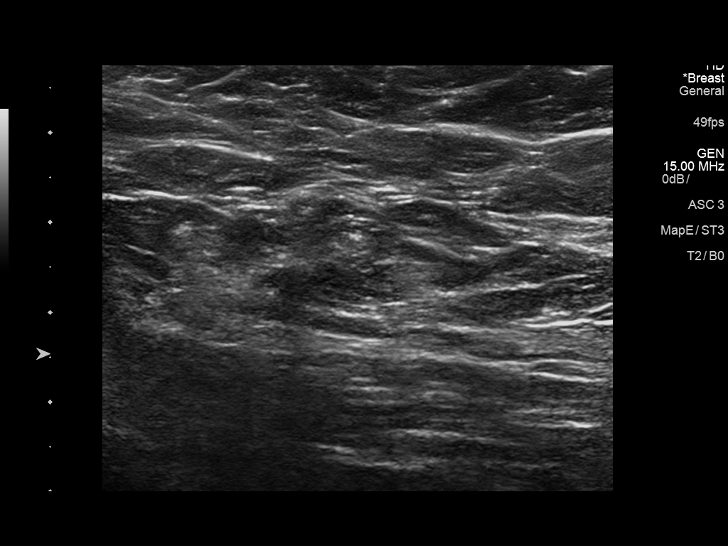

[14 of 14 positions shown; findings below may reference images not displayed]

FINDINGS: In the upper-inner quadrant of the left breast there is a 2 cm mass
associated with distortion. No suspicious mass or malignant type
microcalcifications identified in the right breast.

Mammographic images were processed with CAD.

On physical exam, I palpate a discrete mass in the left breast at
[DATE] 3 cm from the nipple.

Targeted ultrasound is performed, showing an irregular hypo echoic
mass in the left breast at [DATE] 3 cm from the nipple measuring 6 x
7 x 6 mm. It is surrounded by hyperechogenicity. Sonographic
evaluation the left axilla does not show any enlarged adenopathy.
IMPRESSION: Suspicious mass in the [DATE] region of the left breast.

RECOMMENDATION:
Ultrasound-guided core biopsy of the left breast mass is
recommended.

I have discussed the findings and recommendations with the patient.
Results were also provided in writing at the conclusion of the
visit. If applicable, a reminder letter will be sent to the patient
regarding the next appointment.

BI-RADS CATEGORY  4: Suspicious.

## 2019-05-21 ENCOUNTER — Other Ambulatory Visit: Payer: Self-pay

## 2019-05-21 ENCOUNTER — Emergency Department
Admission: EM | Admit: 2019-05-21 | Discharge: 2019-05-21 | Disposition: A | Discharge status: Home / Self Care | Payer: Medicare Other | Attending: Emergency Medicine | Admitting: Emergency Medicine

## 2019-05-21 ENCOUNTER — Emergency Department: Payer: Medicare Other

## 2019-05-21 DIAGNOSIS — Z95810 Presence of automatic (implantable) cardiac defibrillator: Secondary | ICD-10-CM | POA: Insufficient documentation

## 2019-05-21 DIAGNOSIS — M545 Low back pain: Secondary | ICD-10-CM | POA: Diagnosis not present

## 2019-05-21 DIAGNOSIS — R55 Syncope and collapse: Secondary | ICD-10-CM | POA: Diagnosis not present

## 2019-05-21 DIAGNOSIS — I2510 Atherosclerotic heart disease of native coronary artery without angina pectoris: Secondary | ICD-10-CM | POA: Diagnosis not present

## 2019-05-21 DIAGNOSIS — Z87891 Personal history of nicotine dependence: Secondary | ICD-10-CM | POA: Diagnosis not present

## 2019-05-21 DIAGNOSIS — R109 Unspecified abdominal pain: Secondary | ICD-10-CM | POA: Diagnosis not present

## 2019-05-21 DIAGNOSIS — I252 Old myocardial infarction: Secondary | ICD-10-CM | POA: Insufficient documentation

## 2019-05-21 DIAGNOSIS — K55059 Acute (reversible) ischemia of intestine, part and extent unspecified: Secondary | ICD-10-CM | POA: Diagnosis not present

## 2019-05-21 DIAGNOSIS — M549 Dorsalgia, unspecified: Secondary | ICD-10-CM

## 2019-05-21 LAB — BASIC METABOLIC PANEL
Anion gap: 9 (ref 5–15)
BUN: 24 mg/dL — ABNORMAL HIGH (ref 8–23)
CO2: 23 mmol/L (ref 22–32)
Calcium: 9 mg/dL (ref 8.9–10.3)
Chloride: 104 mmol/L (ref 98–111)
Creatinine, Ser: 1.06 mg/dL — ABNORMAL HIGH (ref 0.44–1.00)
GFR calc Af Amer: 55 mL/min — ABNORMAL LOW (ref >60)
GFR calc non Af Amer: 47 mL/min — ABNORMAL LOW (ref >60)
Glucose, Bld: 117 mg/dL — ABNORMAL HIGH (ref 70–99)
Potassium: 4.6 mmol/L (ref 3.5–5.1)
Sodium: 136 mmol/L (ref 135–145)

## 2019-05-21 LAB — URINALYSIS, COMPLETE (UACMP) WITH MICROSCOPIC
Bacteria, UA: NONE SEEN
Bilirubin Urine: NEGATIVE
Glucose, UA: NEGATIVE mg/dL
Hgb urine dipstick: NEGATIVE
Ketones, ur: NEGATIVE mg/dL
Nitrite: NEGATIVE
Protein, ur: NEGATIVE mg/dL
Specific Gravity, Urine: 1.012 (ref 1.005–1.030)
pH: 5 (ref 5.0–8.0)

## 2019-05-21 LAB — CBC
HCT: 37.8 % (ref 36.0–46.0)
Hemoglobin: 12.3 g/dL (ref 12.0–15.0)
MCH: 30 pg (ref 26.0–34.0)
MCHC: 32.5 g/dL (ref 30.0–36.0)
MCV: 92.2 fL (ref 80.0–100.0)
Platelets: 191 10*3/uL (ref 150–400)
RBC: 4.1 MIL/uL (ref 3.87–5.11)
RDW: 13.8 % (ref 11.5–15.5)
WBC: 8.8 10*3/uL (ref 4.0–10.5)
nRBC: 0 % (ref 0.0–0.2)

## 2019-05-21 LAB — HEPATIC FUNCTION PANEL
ALT: 20 U/L (ref 0–44)
AST: 21 U/L (ref 15–41)
Albumin: 3.7 g/dL (ref 3.5–5.0)
Alkaline Phosphatase: 97 U/L (ref 38–126)
Bilirubin, Direct: 0.1 mg/dL (ref 0.0–0.2)
Indirect Bilirubin: 0.5 mg/dL (ref 0.3–0.9)
Total Bilirubin: 0.6 mg/dL (ref 0.3–1.2)
Total Protein: 6 g/dL — ABNORMAL LOW (ref 6.5–8.1)

## 2019-05-21 LAB — TROPONIN I (HIGH SENSITIVITY)
Troponin I (High Sensitivity): 5 ng/L (ref <18)
Troponin I (High Sensitivity): 5 ng/L (ref <18)

## 2019-05-21 LAB — GLUCOSE, CAPILLARY: Glucose-Capillary: 111 mg/dL — ABNORMAL HIGH (ref 70–99)

## 2019-05-21 LAB — LIPASE, BLOOD: Lipase: 40 U/L (ref 11–51)

## 2019-05-21 MED ORDER — SODIUM CHLORIDE 0.9 % IV BOLUS
500.0000 mL | Freq: Once | INTRAVENOUS | Status: AC
  Administered 2019-05-21: 500 mL via INTRAVENOUS

## 2019-05-21 MED ORDER — SODIUM CHLORIDE 0.9 % IV SOLN: Freq: Once | INTRAVENOUS | Status: DC

## 2019-05-21 MED ORDER — SODIUM CHLORIDE 0.9 % IV BOLUS: 500.0000 mL | Freq: Once | INTRAVENOUS | Status: DC

## 2019-05-21 MED ORDER — IOHEXOL 9 MG/ML PO SOLN
500.0000 mL | Freq: Two times a day (BID) | ORAL | Status: DC | PRN
  Administered 2019-05-21 (×2): 500 mL via ORAL

## 2019-05-21 MED ORDER — IOHEXOL 300 MG/ML  SOLN
100.0000 mL | Freq: Once | INTRAMUSCULAR | Status: AC | PRN
  Administered 2019-05-21: 100 mL via INTRAVENOUS

## 2019-05-21 NOTE — ED Provider Notes (Signed)
  Physical Exam  BP 140/75   Pulse 69   Temp 97.9 F (36.6 C)   Resp 14   Ht 5' 3.5" (1.613 m)   Wt 58.1 kg   SpO2 99%   BMI 22.32 kg/m   Physical Exam  ED Course/Procedures     Procedures  MDM  Care assumed at 3 PM.  Patient here with syncope.  Patient has some intermittent abdominal pain for the last several months.  Saw her doctor recently and had a UA performed but with no results.  Patient was noted to be orthostatic initially.  Signout pending reassessment.  4 pm Had some abdominal pain currently. Will get CT ab/pel. Initial trop negative. Will get repeat trop. Has [employer] pacemaker and will get it interrogated   7:49 PM Interrogation showed no events.  Her repeat troponin is negative.  She does have some colitis on CT but also narrowing of celiac axis.  She is afebrile and has no leukocytosis.  Wonder if she has mild chronic mesenteric ischemia causing her symptoms.  At this point, I do not think she needs antibiotics.  She received IV fluids and her blood pressure is normal now.  Told her to stay hydrated and will have her follow-up with vascular surgery outpatient.      Drenda Freeze, MD 05/21/19 [phone]

## 2019-05-21 NOTE — ED Triage Notes (Addendum)
pt arrives via ems from home. whitnessed syncopal episode, assisted to ground. Denies hitting head ems reports a demand pace maker. pt seen at primary Md for possible UTI yesterday, results currently not back. c/o lower back, belly pain x 3 weeks. NAD noted at this time.  ems vitals BP 96/40 HR 70 100% room air.

## 2019-05-21 NOTE — ED Provider Notes (Signed)
[facility] Emergency Department Provider Note       Time seen: ----------------------------------------- 1:55 PM on 05/21/2019 -----------------------------------------   I have reviewed the triage vital signs and the nursing notes.  HISTORY   Chief Complaint Loss of Consciousness   HPI Alicia Patterson is a 86 y.o. female with a history of cancer, coronary disease, GERD, glaucoma who presents to the ED for witnessed syncopal event x2.  Patient was assisted to the ground, denies hitting her head.  She was seen at primary care doctor for possible UTI yesterday.  She complains of low back and abdominal pain for the past 3 weeks.  Past Medical History:  Diagnosis Date  . Cancer ([place])   . Coronary artery disease   . GERD (gastroesophageal reflux disease)   . Glaucoma     Patient Active Problem List   Diagnosis Date Noted  . Chest pain 01/15/2018  . Glaucoma 02/16/2017  . Glaucoma of both eyes 02/16/2017  . S/P drug eluting coronary stent placement 01/07/2017  . ICD (implantable cardioverter-defibrillator), dual, in situ 12/23/2016  . Acute systolic CHF (congestive heart failure) ([place]) 12/16/2016  . CAD (coronary artery disease) 12/16/2016  . NSTEMI (non-ST elevated myocardial infarction) ([place]) 12/15/2016  . Recurrent UTI 03/20/2015  . Urinary tract infection 03/20/2015  . Small bowel obstruction ([place]) 12/29/2014  . Esophageal reflux 11/10/2013  . C2 cervical fracture ([place]) 06/05/2011  . Fracture, metacarpal, neck 05/30/2011    Past Surgical History:  Procedure Laterality Date  . ABDOMINAL HYSTERECTOMY    . BREAST BIOPSY Left 05/22/2016   neg-ORGANIZING FAT NECROSIS. VASCULAR CALCS   . CHOLECYSTECTOMY    . LEFT HEART CATH AND CORONARY ANGIOGRAPHY N/A 12/15/2016   Procedure: LEFT HEART CATH AND CORONARY ANGIOGRAPHY;  Surgeon: Dionisio David, MD;  Location: [facility];  Service: Cardiovascular;  Laterality: N/A;  . TONSILLECTOMY       Allergies Amoxicillin-pot clavulanate, Cefdinir, Gatifloxacin, Sulfa antibiotics, and Sulfasalazine  Social History Social History   Tobacco Use  . Smoking status: Former Smoker    Quit date: 1979    Years since quitting: 42.2  . Smokeless tobacco: Never Used  Substance Use Topics  . Alcohol use: No  . Drug use: No    Review of Systems Constitutional: Negative for fever. Cardiovascular: Negative for chest pain. Respiratory: Negative for shortness of breath. Gastrointestinal: Positive for abdominal pain Musculoskeletal: Positive for back pain Skin: Negative for rash. Neurological: Negative for headaches, focal weakness or numbness.  All systems negative/normal/unremarkable except as stated in the HPI  ____________________________________________   PHYSICAL EXAM:  VITAL SIGNS: ED Triage Vitals  Enc Vitals Group     BP 05/21/19 1345 114/71     Pulse Rate 05/21/19 1345 67     Resp 05/21/19 1345 19     Temp 05/21/19 1347 97.9 F (36.6 C)     Temp src --      SpO2 05/21/19 1345 98 %     Weight 05/21/19 1339 128 lb (58.1 kg)     Height 05/21/19 1339 5' 3.5" (1.613 m)     Head Circumference --      Peak Flow --      Pain Score 05/21/19 1339 0     Pain Loc --      Pain Edu? --      Excl. in [place]? --     Constitutional: Alert and oriented. Well appearing and in no distress. Eyes: Conjunctivae are normal. Normal extraocular movements.  Cardiovascular: Normal rate, regular rhythm. No murmurs, rubs, or gallops. Respiratory: Normal respiratory effort without tachypnea nor retractions. Breath sounds are clear and equal bilaterally. No wheezes/rales/rhonchi. Gastrointestinal: Soft and nontender. Normal bowel sounds Musculoskeletal: Nontender with normal range of motion in extremities. No lower extremity tenderness nor edema. Neurologic:  Normal speech and language. No gross focal neurologic deficits are appreciated.  Skin:  Skin is warm, dry and intact. No rash  noted. Psychiatric: Mood and affect are normal. Speech and behavior are normal.  ____________________________________________  EKG: Interpreted by me.  Sinus rhythm with rate of 64 bpm, short PR interval, nonspecific T wave abnormalities, normal QT  ____________________________________________  ED COURSE:  As part of my medical decision making, I reviewed the following data within the [place] History obtained from family if available, nursing notes, old chart and ekg, as well as notes from prior ED visits. Patient presented for syncope, we will assess with labs and imaging as indicated at this time.   Procedures  [name] was evaluated in Emergency Department on 05/21/2019 for the symptoms described in the history of present illness. She was evaluated in the context of the global COVID-19 pandemic, which necessitated consideration that the patient might be at risk for infection with the SARS-CoV-2 virus that causes COVID-19. Institutional protocols and algorithms that pertain to the evaluation of patients at risk for COVID-19 are in a state of rapid change based on information released by regulatory bodies including the CDC and federal and state organizations. These policies and algorithms were followed during the patient's care in the ED.  ____________________________________________   LABS (pertinent positives/negatives)  Labs Reviewed  BASIC METABOLIC PANEL - Abnormal; Notable for the following components:      Result Value   Glucose, Bld 117 (*)    BUN 24 (*)    Creatinine, Ser 1.06 (*)    GFR calc non Af Amer 47 (*)    GFR calc Af Amer 55 (*)    All other components within normal limits  URINALYSIS, COMPLETE (UACMP) WITH MICROSCOPIC - Abnormal; Notable for the following components:   Color, Urine YELLOW (*)    APPearance CLEAR (*)    Leukocytes,Ua SMALL (*)    All other components within normal limits  GLUCOSE, CAPILLARY - Abnormal; Notable for the  following components:   Glucose-Capillary 111 (*)    All other components within normal limits  CBC  CBG MONITORING, ED  TROPONIN I (HIGH SENSITIVITY)  TROPONIN I (HIGH SENSITIVITY)    RADIOLOGY Images were viewed by me  Chest x-ray IMPRESSION:  No active disease.   Aortic Atherosclerosis (ICD10-I70.0).  ____________________________________________   DIFFERENTIAL DIAGNOSIS   CHF, COPD, pneumonia, dehydration, arrhythmia, MI, CVA, TIA  FINAL ASSESSMENT AND PLAN  Syncope   Plan: The patient had presented for a syncopal event. Patient's labs indicated some dehydration for which she was gently given some IV fluids. Patient's imaging did not reveal any acute process.  She was somewhat orthostatic here.   Laurence Aly, MD    Note: This note was generated in part or whole with voice recognition software. Voice recognition is usually quite accurate but there are transcription errors that can and very often do occur. I apologize for any typographical errors that were not detected and corrected.     Earleen Newport, MD 05/21/19 1535

## 2019-05-21 NOTE — ED Notes (Signed)
Pacemaker interrogated at this time by this RN. Pt began drinking oral contrast.

## 2019-05-21 NOTE — ED Notes (Signed)
Pt taken to CT via stretcher.

## 2019-05-21 NOTE — Discharge Instructions (Signed)
Stay hydrated  Continue current meds.  One of the arteries in your abdomen is very narrow and can cause your symptoms.  Please follow-up with the vascular surgeon, Dr. Trula Slade.   See your doctor  Return to ER if you have worse abdominal pain, vomiting, fever, passing out.

## 2019-05-29 ENCOUNTER — Other Ambulatory Visit: Payer: Self-pay

## 2019-05-29 ENCOUNTER — Emergency Department
Admission: EM | Admit: 2019-05-29 | Discharge: 2019-05-29 | Disposition: A | Discharge status: Home / Self Care | Payer: Medicare Other | Attending: Emergency Medicine | Admitting: Emergency Medicine

## 2019-05-29 DIAGNOSIS — Z7901 Long term (current) use of anticoagulants: Secondary | ICD-10-CM | POA: Diagnosis not present

## 2019-05-29 DIAGNOSIS — I5021 Acute systolic (congestive) heart failure: Secondary | ICD-10-CM | POA: Diagnosis not present

## 2019-05-29 DIAGNOSIS — R310 Gross hematuria: Secondary | ICD-10-CM

## 2019-05-29 DIAGNOSIS — I2510 Atherosclerotic heart disease of native coronary artery without angina pectoris: Secondary | ICD-10-CM | POA: Insufficient documentation

## 2019-05-29 DIAGNOSIS — Z7982 Long term (current) use of aspirin: Secondary | ICD-10-CM | POA: Insufficient documentation

## 2019-05-29 DIAGNOSIS — N390 Urinary tract infection, site not specified: Secondary | ICD-10-CM | POA: Diagnosis not present

## 2019-05-29 DIAGNOSIS — Z79899 Other long term (current) drug therapy: Secondary | ICD-10-CM | POA: Diagnosis not present

## 2019-05-29 LAB — CBC
HCT: 37.1 % (ref 36.0–46.0)
Hemoglobin: 12.3 g/dL (ref 12.0–15.0)
MCH: 30.3 pg (ref 26.0–34.0)
MCHC: 33.2 g/dL (ref 30.0–36.0)
MCV: 91.4 fL (ref 80.0–100.0)
Platelets: 195 10*3/uL (ref 150–400)
RBC: 4.06 MIL/uL (ref 3.87–5.11)
RDW: 13.6 % (ref 11.5–15.5)
WBC: 11.1 10*3/uL — ABNORMAL HIGH (ref 4.0–10.5)
nRBC: 0 % (ref 0.0–0.2)

## 2019-05-29 LAB — URINALYSIS, COMPLETE (UACMP) WITH MICROSCOPIC
RBC / HPF: >50 RBC/hpf — ABNORMAL HIGH (ref 0–5)
Specific Gravity, Urine: 1.02 (ref 1.005–1.030)
Squamous Epithelial / HPF: NONE SEEN (ref 0–5)
WBC, UA: >50 WBC/hpf — ABNORMAL HIGH (ref 0–5)

## 2019-05-29 LAB — COMPREHENSIVE METABOLIC PANEL
ALT: 24 U/L (ref 0–44)
AST: 23 U/L (ref 15–41)
Albumin: 4.3 g/dL (ref 3.5–5.0)
Alkaline Phosphatase: 114 U/L (ref 38–126)
Anion gap: 8 (ref 5–15)
BUN: 18 mg/dL (ref 8–23)
CO2: 24 mmol/L (ref 22–32)
Calcium: 8.7 mg/dL — ABNORMAL LOW (ref 8.9–10.3)
Chloride: 104 mmol/L (ref 98–111)
Creatinine, Ser: 0.89 mg/dL (ref 0.44–1.00)
GFR calc Af Amer: >60 mL/min (ref >60)
GFR calc non Af Amer: 58 mL/min — ABNORMAL LOW (ref >60)
Glucose, Bld: 114 mg/dL — ABNORMAL HIGH (ref 70–99)
Potassium: 3.9 mmol/L (ref 3.5–5.1)
Sodium: 136 mmol/L (ref 135–145)
Total Bilirubin: 0.7 mg/dL (ref 0.3–1.2)
Total Protein: 7 g/dL (ref 6.5–8.1)

## 2019-05-29 MED ORDER — NITROFURANTOIN MONOHYD MACRO 100 MG PO CAPS
100.0000 mg | ORAL_CAPSULE | Freq: Once | ORAL | Status: AC
  Administered 2019-05-29: 100 mg via ORAL
  Filled 2019-05-29: qty 1

## 2019-05-29 MED ORDER — NITROFURANTOIN MONOHYD MACRO 100 MG PO CAPS: 100.0000 mg | ORAL_CAPSULE | Freq: Two times a day (BID) | ORAL | 0 refills | Status: AC

## 2019-05-29 NOTE — ED Triage Notes (Addendum)
Pt arrived with c/o blood in urine - pt states that the hematuria started this am while at church and she voided 4 times in less than 3 hours - pt c/o lower bad and lower back pain - reports dysuria  Pt urine sample at this time has gross hematuria

## 2019-05-29 NOTE — ED Notes (Signed)
Pt ambulated from triage with c/o hematuria since today with ongoing left flank and low back and diffuse abdominal pain since last week.  GCS 15, rr even/unlabored, +pmsc x4, skin warm/pionk/dry VSS Dr. Corky Downs at bedside for assessment.  Placed onbedside monitor.  VSS will continue to monitor

## 2019-05-29 NOTE — ED Provider Notes (Signed)
[facility] Emergency Department Provider Note   ____________________________________________    I have reviewed the triage vital signs and the nursing notes.   HISTORY  Chief Complaint Hematuria     HPI Alicia Patterson is a 86 y.o. female who presents with complaints of frequent urination, dysuria and hematuria which started today.  Patient reports she had to urinate frequently today at church which is atypical for her, she notes that the urine was quite dark as well and she reports discomfort with urination.  Denies fevers or chills.  Does report chronic back pain for greater than a year, unchanged.  Reports history of frequent UTIs and does have a history of a prolapsed bladder however has not had a urinary tract infection in some time.  Was seen in the emergency department 1 week ago after a syncopal episode, had CT scan at that time.  Past Medical History:  Diagnosis Date  . Cancer ([place])   . Coronary artery disease   . GERD (gastroesophageal reflux disease)   . Glaucoma     Patient Active Problem List   Diagnosis Date Noted  . Chest pain 01/15/2018  . Glaucoma 02/16/2017  . Glaucoma of both eyes 02/16/2017  . S/P drug eluting coronary stent placement 01/07/2017  . ICD (implantable cardioverter-defibrillator), dual, in situ 12/23/2016  . Acute systolic CHF (congestive heart failure) ([place]) 12/16/2016  . CAD (coronary artery disease) 12/16/2016  . NSTEMI (non-ST elevated myocardial infarction) ([place]) 12/15/2016  . Recurrent UTI 03/20/2015  . Urinary tract infection 03/20/2015  . Small bowel obstruction ([place]) 12/29/2014  . Esophageal reflux 11/10/2013  . C2 cervical fracture ([place]) 06/05/2011  . Fracture, metacarpal, neck 05/30/2011    Past Surgical History:  Procedure Laterality Date  . ABDOMINAL HYSTERECTOMY    . BREAST BIOPSY Left 05/22/2016   neg-ORGANIZING FAT NECROSIS. VASCULAR CALCS   . CHOLECYSTECTOMY    . LEFT HEART CATH AND  CORONARY ANGIOGRAPHY N/A 12/15/2016   Procedure: LEFT HEART CATH AND CORONARY ANGIOGRAPHY;  Surgeon: Dionisio David, MD;  Location: [place] [facility];  Service: Cardiovascular;  Laterality: N/A;  . TONSILLECTOMY      Prior to Admission medications   Medication Sig Start Date End Date Taking? Authorizing Provider  aspirin EC 81 MG tablet Take 81 mg by mouth daily.    [provider]  atorvastatin (LIPITOR) 40 MG tablet Take 1 tablet (40 mg total) by mouth daily at 6 PM. 12/15/16   Epifanio Lesches, MD  brimonidine-timolol (COMBIGAN) 0.2-0.5 % ophthalmic solution Place 1 drop into both eyes every 12 (twelve) hours.    [provider]  carvedilol (COREG) 6.25 MG tablet Take 6.25 mg by mouth 2 (two) times daily with a meal.  02/13/17   [provider]  clobetasol ointment (TEMOVATE) [id number] % Apply 1 application topically every other day. 11/06/16   [provider]  clopidogrel (PLAVIX) 75 MG tablet Take 75 mg by mouth daily.  02/13/17   [provider]  dorzolamide (TRUSOPT) 2 % ophthalmic solution Place 1 drop into both eyes daily. 08/31/15   [provider]  famotidine (PEPCID) 40 MG tablet Take 40 mg by mouth at bedtime. 05/04/19   [provider]  isosorbide mononitrate (IMDUR) 30 MG 24 hr tablet Take 1 tablet by mouth daily in the afternoon. 05/05/19   [provider]  latanoprost (XALATAN) 0.005 % ophthalmic solution Place 1 drop into both eyes daily.    [provider]  lisinopril (PRINIVIL,ZESTRIL) 5 MG tablet Take 5 mg by mouth daily.    [provider]  Multiple Vitamin (MULTI-VITAMINS) TABS Take 1 tablet by mouth daily in the afternoon.     [provider]  Multiple Vitamins-Minerals (PRESERVISION AREDS 2 PO) Take 1 tablet by mouth daily in the afternoon.    [provider]  nitrofurantoin, macrocrystal-monohydrate, (MACROBID) 100 MG capsule Take 1 capsule (100 mg total) by mouth 2  (two) times daily for 7 days. 05/29/19 06/05/19  Lavonia Drafts, MD  nitroGLYCERIN (NITROSTAT) 0.3 MG SL tablet Place 1 tablet (0.3 mg total) under the tongue every 5 (five) minutes as needed for chest pain. 12/15/16 01/15/18  Epifanio Lesches, MD  omeprazole (PRILOSEC) 40 MG capsule Take 1 capsule by mouth daily in the afternoon. 05/05/19   [provider]     Allergies Amoxicillin-pot clavulanate, Cefdinir, Gatifloxacin, Sulfa antibiotics, and Sulfasalazine  Family History  Problem Relation Age of Onset  . Breast cancer Sister 85  . Breast cancer Other 55    Social History Social History   Tobacco Use  . Smoking status: Former Smoker    Quit date: 1979    Years since quitting: 42.2  . Smokeless tobacco: Never Used  Substance Use Topics  . Alcohol use: No  . Drug use: No    Review of Systems  Constitutional: No fever/chills Eyes: No visual changes.  ENT: No sore throat. Cardiovascular: Denies chest pain. Respiratory: Denies shortness of breath. Gastrointestinal: No abdominal pain Genitourinary: As above Musculoskeletal: As above Skin: Negative for rash. Neurological: Negative for headaches or weakness   ____________________________________________   PHYSICAL EXAM:  VITAL SIGNS: ED Triage Vitals  Enc Vitals Group     BP 05/29/19 1515 (!) 196/80     Pulse Rate 05/29/19 1515 79     Resp 05/29/19 1515 16     Temp 05/29/19 1515 98 F (36.7 C)     Temp Source 05/29/19 1515 Oral     SpO2 05/29/19 1515 99 %     Weight 05/29/19 1516 58.1 kg (128 lb)     Height 05/29/19 1516 1.6 m (5\' 3" )     Head Circumference --      Peak Flow --      Pain Score 05/29/19 1515 5     Pain Loc --      Pain Edu? --      Excl. in [place]? --     Constitutional: Alert and oriented.  Nose: No congestion/rhinnorhea. Mouth/Throat: Mucous membranes are moist.    Cardiovascular: Normal rate, regular rhythm.  Good peripheral circulation. Respiratory: Normal respiratory effort.   No retractions.  Gastrointestinal: Soft and nontender. No distention.  No CVA tenderness.  Reassuring exam Genitourinary: deferred Musculoskeletal: No vertebral tenderness palpation, normal range of motion.  Warm and well perfused Neurologic:  Normal speech and language. No gross focal neurologic deficits are appreciated.  Skin:  Skin is warm, dry and intact. No rash noted. Psychiatric: Mood and affect are normal. Speech and behavior are normal.  ____________________________________________   LABS (all labs ordered are listed, but only abnormal results are displayed)  Labs Reviewed  COMPREHENSIVE METABOLIC PANEL - Abnormal; Notable for the following components:      Result Value   Glucose, Bld 114 (*)    Calcium 8.7 (*)    GFR calc non Af Amer 58 (*)    All other components within normal limits  CBC - Abnormal; Notable for the following components:   WBC 11.1 (*)  All other components within normal limits  URINALYSIS, COMPLETE (UACMP) WITH MICROSCOPIC - Abnormal; Notable for the following components:   Color, Urine RED (*)    APPearance CLOUDY (*)    Glucose, UA   (*)    Value: TEST NOT REPORTED DUE TO COLOR INTERFERENCE OF URINE PIGMENT   Hgb urine dipstick   (*)    Value: TEST NOT REPORTED DUE TO COLOR INTERFERENCE OF URINE PIGMENT   Bilirubin Urine   (*)    Value: TEST NOT REPORTED DUE TO COLOR INTERFERENCE OF URINE PIGMENT   Ketones, ur   (*)    Value: TEST NOT REPORTED DUE TO COLOR INTERFERENCE OF URINE PIGMENT   Protein, ur   (*)    Value: TEST NOT REPORTED DUE TO COLOR INTERFERENCE OF URINE PIGMENT   Nitrite   (*)    Value: TEST NOT REPORTED DUE TO COLOR INTERFERENCE OF URINE PIGMENT   Leukocytes,Ua   (*)    Value: TEST NOT REPORTED DUE TO COLOR INTERFERENCE OF URINE PIGMENT   RBC / HPF >50 (*)    WBC, UA >50 (*)    Bacteria, UA MANY (*)    All other components within normal limits   ____________________________________________  EKG  None____________________________________________  RADIOLOGY  Reviewed CT abdomen pelvis from last week, no kidney stones at that time ____________________________________________   PROCEDURES  Procedure(s) performed: No  Procedures   Critical Care performed: No ____________________________________________   INITIAL IMPRESSION / ASSESSMENT AND PLAN / ED COURSE  Pertinent labs & imaging results that were available during my care of the patient were reviewed by me and considered in my medical decision making (see chart for details).  Patient presents with hematuria, dysuria, frequency.  Urinalysis limited due to hematuria however many bacteria, white blood cells/clumps noted.  She is on Plavix.  Highly suspicious for urinary tract infection causing hematuria.  No kidney stones on CT scan last week makes ureterolithiasis highly unlikely.  Well-appearing, nontoxic.  Will try oral antibiotics, if no improvement will need close urology follow-up.    ____________________________________________   FINAL CLINICAL IMPRESSION(S) / ED DIAGNOSES  Final diagnoses:  Gross hematuria  Lower urinary tract infectious disease        Note:  This document was prepared using Dragon voice recognition software and may include unintentional dictation errors.   Lavonia Drafts, MD 05/29/19 [phone]

## 2019-06-15 NOTE — Progress Notes (Signed)
06/16/19 2:25 PM   Alicia Patterson 1930-07-17 [id number]  Referring provider: Derinda Late, MD [phone] [address] [facility] [place],  [place] [address]  Chief Complaint  Patient presents with  . Hematuria    HPI: Alicia Patterson is a 86 y.o. F who presents today for the evaluation and management of urinary symptoms after ED visit.   She was seen by a Dr. Salome Holmes, a urogynecologyist, in 2018 and was diagnosed for vaginal atrophy. She was started on topical estrogen cream and advised to take cranberry tablets  She has a personal hx of mixed incontinence and bladder prolapse. She was started on suppressive antibiotics daily for refractory infections.   She saw her PCP in mid March for low back pain and bilateral hip and knee pain. Back pain exacerbated by physical activity.  Visited ED on 05/29/19 c/o frequent urination, dysuria and gross hematuria onset day of ED visit. F/u urinalysis indicated >50 RBC/hpf, >50 WBC, and many bacteria. No culture sent. She was given oral antibiotics during ED visit.  No abnormal GU pathology on recent upper tract imaging. Marland Kitchen   She was started on Macrobid 3 days ago by her cardiologist since she was experiencing burning w/ urination. No associated culture documented. Only 1 positive urine culture as noted below. UA in 04/2019 unremarkable.   She is currently experiencing dysuria and has been taking Azo cranberry tablet for hopeful symptom relief.  She has not been using her topical estrogen cream started by Dr. Salome Holmes due to financial reasons.   She provided a handout of all the antibiotics she has been prescribed. She was prescribed antibiotics q month from various physicians and often without UA/ UCx data to support treatment.   +Urine culture  02/14/19 indicative of E. Coli and Staph. lugdunensis resistant to Ampicillin   PMH: Past Medical History:  Diagnosis Date  . Cancer ([place])   . Coronary artery disease   .  GERD (gastroesophageal reflux disease)   . Glaucoma     Surgical History: Past Surgical History:  Procedure Laterality Date  . ABDOMINAL HYSTERECTOMY    . BREAST BIOPSY Left 05/22/2016   neg-ORGANIZING FAT NECROSIS. VASCULAR CALCS   . CHOLECYSTECTOMY    . LEFT HEART CATH AND CORONARY ANGIOGRAPHY N/A 12/15/2016   Procedure: LEFT HEART CATH AND CORONARY ANGIOGRAPHY;  Surgeon: Dionisio David, MD;  Location: [place] [facility];  Service: Cardiovascular;  Laterality: N/A;  . TONSILLECTOMY      Home Medications:  Allergies as of 06/16/2019      Reactions   Amoxicillin-pot Clavulanate    Other reaction(s): Unknown Pt states she is not allergic to this medication   Cefdinir    Other reaction(s): Other (See Comments)   Gatifloxacin    Other reaction(s): Unknown Other reaction(s): Other (See Comments)   Sulfa Antibiotics Rash   Sulfasalazine Rash      Medication List       Accurate as of June 16, 2019 11:59 PM. If you have any questions, ask your nurse or doctor.        STOP taking these medications   nitrofurantoin (macrocrystal-monohydrate) 100 MG capsule Commonly known as: MACROBID Stopped by: Hollice Espy, MD   nitroGLYCERIN 0.3 MG SL tablet Commonly known as: Nitrostat Stopped by: Hollice Espy, MD     TAKE these medications   amoxicillin-clavulanate 875-125 MG tablet Commonly known as: Augmentin Take 1 tablet by mouth 2 (two) times daily for 14 days.  Started by: Hollice Espy, MD   aspirin EC 81 MG tablet Take 81 mg by mouth daily.   atorvastatin 40 MG tablet Commonly known as: LIPITOR Take 1 tablet (40 mg total) by mouth daily at 6 PM.   brimonidine-timolol 0.2-0.5 % ophthalmic solution Commonly known as: COMBIGAN Place 1 drop into both eyes every 12 (twelve) hours.   carvedilol 6.25 MG tablet Commonly known as: COREG Take 6.25 mg by mouth 2 (two) times daily with a meal.   clobetasol ointment 0.05 % Commonly known as: TEMOVATE Apply 1  application topically every other day.   clopidogrel 75 MG tablet Commonly known as: PLAVIX Take 75 mg by mouth daily.   dorzolamide 2 % ophthalmic solution Commonly known as: TRUSOPT Place 1 drop into both eyes daily.   famotidine 40 MG tablet Commonly known as: PEPCID Take 40 mg by mouth at bedtime.   isosorbide mononitrate 30 MG 24 hr tablet Commonly known as: IMDUR Take 1 tablet by mouth daily in the afternoon.   latanoprost 0.005 % ophthalmic solution Commonly known as: XALATAN Place 1 drop into both eyes daily.   lisinopril 5 MG tablet Commonly known as: ZESTRIL Take 5 mg by mouth daily.   Multi-Vitamins Tabs Take 1 tablet by mouth daily in the afternoon.   omeprazole 40 MG capsule Commonly known as: PRILOSEC Take 1 capsule by mouth daily in the afternoon.   Premarin vaginal cream Generic drug: conjugated estrogens Place 1 Applicatorful vaginally daily. Use pea sized amount M-W-Fr before bedtime Started by: Hollice Espy, MD   PRESERVISION AREDS 2 PO Take 1 tablet by mouth daily in the afternoon.       Allergies:  Allergies  Allergen Reactions  . Amoxicillin-Pot Clavulanate     Other reaction(s): Unknown  Pt states she is not allergic to this medication  . Cefdinir     Other reaction(s): Other (See Comments)  . Gatifloxacin     Other reaction(s): Unknown Other reaction(s): Other (See Comments)  . Sulfa Antibiotics Rash  . Sulfasalazine Rash    Family History: Family History  Problem Relation Age of Onset  . Breast cancer Sister 67  . Breast cancer Other 50  . Prostate cancer Brother     Social History:  reports that she quit smoking about 42 years ago. She has never used smokeless tobacco. She reports that she does not drink alcohol or use drugs.   Physical Exam: BP (!) 146/77   Pulse 63   Ht 5\' 3"  (1.6 m)   Wt 128 lb (58.1 kg)   BMI 22.67 kg/m   Constitutional:  Alert and oriented, No acute distress. HEENT: [place] AT, moist mucus  membranes.  Trachea midline, no masses. Cardiovascular: No clubbing, cyanosis, or edema. Respiratory: Normal respiratory effort, no increased work of breathing. Skin: No rashes, bruises or suspicious lesions. Neurologic: Grossly intact, no focal deficits, moving all 4 extremities. Psychiatric: Normal mood and affect.  Laboratory Data:  Lab Results  Component Value Date   CREATININE 0.89 05/29/2019    Urinalysis UA today >50 WBC and many bacteria.  Assessment & Plan:    1. Dysuria Urine culture sent  Encouraged to continue Azo cranberry tablets We will go ahead and treat with Augmentin today, stop taking Macrobid as this is not improved her symptoms and her urine remains frankly positive on this medication, suspect bacterial resistance to this  Rx of Augmentin given 2x p.o. daily for 14 days   2. Atrophic vaginitis Resume topical estrogen cream  Rx of Premarin given  3. rUTIs Counseled pt on prevention techniques including probiotics, cranberry tablets and estrogen cream (pea sized amount) 3x a week on urethra tube at night time   Recheck with UA/cytology in 1 month to rule out underlying [place] [address] [place], [place] [address] [phone]  I, Lucas Mallow, am acting as a scribe for Dr. Hollice Espy,  I have reviewed the above documentation for accuracy and completeness, and I agree with the above.   Hollice Espy, MD

## 2019-06-16 ENCOUNTER — Ambulatory Visit: Payer: Self-pay | Admitting: Urology

## 2019-06-16 ENCOUNTER — Other Ambulatory Visit: Payer: Self-pay

## 2019-06-16 ENCOUNTER — Ambulatory Visit (INDEPENDENT_AMBULATORY_CARE_PROVIDER_SITE_OTHER): Payer: Medicare Other | Admitting: Urology

## 2019-06-16 ENCOUNTER — Encounter: Payer: Self-pay | Admitting: Urology

## 2019-06-16 VITALS — BP 146/77 | HR 63 | Ht 63.0 in | Wt 128.0 lb

## 2019-06-16 DIAGNOSIS — N390 Urinary tract infection, site not specified: Secondary | ICD-10-CM | POA: Diagnosis not present

## 2019-06-16 DIAGNOSIS — N952 Postmenopausal atrophic vaginitis: Secondary | ICD-10-CM | POA: Diagnosis not present

## 2019-06-16 DIAGNOSIS — R319 Hematuria, unspecified: Secondary | ICD-10-CM | POA: Diagnosis not present

## 2019-06-16 LAB — MICROSCOPIC EXAMINATION
RBC, Urine: NONE SEEN /hpf (ref 0–2)
WBC, UA: >30 /hpf — ABNORMAL HIGH (ref 0–5)

## 2019-06-16 LAB — URINALYSIS, COMPLETE
Bilirubin, UA: NEGATIVE
Glucose, UA: NEGATIVE
Ketones, UA: NEGATIVE
Nitrite, UA: NEGATIVE
Protein,UA: NEGATIVE
Specific Gravity, UA: 1.02 (ref 1.005–1.030)
Urobilinogen, Ur: 0.2 mg/dL (ref 0.2–1.0)
pH, UA: 5.5 (ref 5.0–7.5)

## 2019-06-16 LAB — BLADDER SCAN AMB NON-IMAGING: Scan Result: 88

## 2019-06-16 MED ORDER — AMOXICILLIN-POT CLAVULANATE 875-125 MG PO TABS: 1.0000 | ORAL_TABLET | Freq: Two times a day (BID) | ORAL | 0 refills | Status: AC

## 2019-06-16 MED ORDER — PREMARIN 0.625 MG/GM VA CREA: 1.0000 | TOPICAL_CREAM | Freq: Every day | VAGINAL | 12 refills | Status: DC

## 2019-06-20 LAB — CULTURE, URINE COMPREHENSIVE

## 2019-07-20 ENCOUNTER — Ambulatory Visit (INDEPENDENT_AMBULATORY_CARE_PROVIDER_SITE_OTHER): Payer: Medicare Other | Admitting: Urology

## 2019-07-20 ENCOUNTER — Other Ambulatory Visit: Payer: Self-pay

## 2019-07-20 ENCOUNTER — Encounter: Payer: Self-pay | Admitting: Urology

## 2019-07-20 VITALS — BP 122/70 | HR 72 | Ht 63.0 in | Wt 128.0 lb

## 2019-07-20 DIAGNOSIS — R3129 Other microscopic hematuria: Secondary | ICD-10-CM | POA: Diagnosis not present

## 2019-07-20 DIAGNOSIS — N952 Postmenopausal atrophic vaginitis: Secondary | ICD-10-CM

## 2019-07-20 DIAGNOSIS — R300 Dysuria: Secondary | ICD-10-CM | POA: Diagnosis not present

## 2019-07-20 MED ORDER — ESTRADIOL 0.1 MG/GM VA CREA: TOPICAL_CREAM | VAGINAL | 12 refills | Status: AC

## 2019-07-20 NOTE — Progress Notes (Signed)
07/20/2019 5:18 PM   Alicia Patterson [birth year] [id number]  Referring provider: Kandyce Rud, MD [phone] [address] [facility] [place],  [place] [address]  Chief Complaint  Patient presents with  . Hematuria    HPI: Mrs. Alicia Patterson is an 86 year old female with dysuria, atrophic vaginitis and rUTI's who presents today for a one month follow up.  Dysuria/ rUTI's Has a history of being prescribed antibiotics from various physicians often without UA/urine culture data to support treatments.  She is still experiencing dysuria in spite of the use of vaginal estrogen cream.  Her UA today is grossly infected.    Atrophic vaginitis She states she is applying the cream three nights weekly, but she is having issues getting the prescription filled.      She is also having issues with frequency, urgency, nocturia x 5-6 and incontinence x 2 times daily, no pad use.  She did try pessary at one point, but it continued to fall out.  She stated she was scheduled for a bladder surgery for her incontinence, but she suffered an MI and therefore was not a suitable surgical candidate.  Patient denies any modifying or aggravating factors.  Patient denies any gross hematuria or suprapubic/flank pain.  Patient denies any fevers, chills, nausea or vomiting.    PMH: Past Medical History:  Diagnosis Date  . Cancer ([facility])   . Coronary artery disease   . GERD (gastroesophageal reflux disease)   . Glaucoma     Surgical History: Past Surgical History:  Procedure Laterality Date  . ABDOMINAL HYSTERECTOMY    . BREAST BIOPSY Left 05/22/2016   neg-ORGANIZING FAT NECROSIS. VASCULAR CALCS   . CHOLECYSTECTOMY    . LEFT HEART CATH AND CORONARY ANGIOGRAPHY N/A 12/15/2016   Procedure: LEFT HEART CATH AND CORONARY ANGIOGRAPHY;  Surgeon: Laurier Nancy, MD;  Location: [facility];  Service: Cardiovascular;  Laterality: N/A;  . TONSILLECTOMY      Home Medications:    Allergies as of 07/20/2019      Reactions   Amoxicillin-pot Clavulanate    Other reaction(s): Unknown Pt states she is not allergic to this medication   Cefdinir    Other reaction(s): Other (See Comments)   Gatifloxacin    Other reaction(s): Unknown Other reaction(s): Other (See Comments)   Sulfa Antibiotics Rash   Sulfasalazine Rash      Medication List       Accurate as of Jul 20, 2019 11:59 PM. If you have any questions, ask your nurse or doctor.        STOP taking these medications   Premarin vaginal cream Generic drug: conjugated estrogens Stopped by: Michiel Cowboy, PA-C     TAKE these medications   aspirin EC 81 MG tablet Take 81 mg by mouth daily.   atorvastatin 40 MG tablet Commonly known as: LIPITOR Take 1 tablet (40 mg total) by mouth daily at 6 PM.   brimonidine-timolol 0.2-0.5 % ophthalmic solution Commonly known as: COMBIGAN Place 1 drop into both eyes every 12 (twelve) hours.   carvedilol 6.25 MG tablet Commonly known as: COREG Take 6.25 mg by mouth 2 (two) times daily with a meal.   clobetasol ointment 0.05 % Commonly known as: TEMOVATE Apply 1 application topically every other day.   clopidogrel 75 MG tablet Commonly known as: PLAVIX Take 75 mg by mouth daily.   dorzolamide 2 % ophthalmic solution Commonly known as: TRUSOPT Place 1 drop into  both eyes daily.   estradiol 0.1 MG/GM vaginal cream Commonly known as: ESTRACE Apply a pea size amount Monday, Wed, Friday night Started by: Carollee Herter Isador Castille, PA-C   famotidine 40 MG tablet Commonly known as: PEPCID Take 40 mg by mouth at bedtime.   isosorbide mononitrate 30 MG 24 hr tablet Commonly known as: IMDUR Take 1 tablet by mouth daily in the afternoon.   latanoprost 0.005 % ophthalmic solution Commonly known as: XALATAN Place 1 drop into both eyes daily.   lisinopril 5 MG tablet Commonly known as: ZESTRIL Take 5 mg by mouth daily.   Multi-Vitamins Tabs Take 1 tablet by mouth  daily in the afternoon.   omeprazole 40 MG capsule Commonly known as: PRILOSEC Take 1 capsule by mouth daily in the afternoon.   PRESERVISION AREDS 2 PO Take 1 tablet by mouth daily in the afternoon.       Allergies:  Allergies  Allergen Reactions  . Amoxicillin-Pot Clavulanate     Other reaction(s): Unknown  Pt states she is not allergic to this medication  . Cefdinir     Other reaction(s): Other (See Comments)  . Gatifloxacin     Other reaction(s): Unknown Other reaction(s): Other (See Comments)  . Sulfa Antibiotics Rash  . Sulfasalazine Rash    Family History: Family History  Problem Relation Age of Onset  . Breast cancer Sister 41  . Breast cancer Other 50  . Prostate cancer Brother     Social History:  reports that she quit smoking about 42 years ago. She has never used smokeless tobacco. She reports that she does not drink alcohol or use drugs.  ROS: Pertinent ROS in HPI  Physical Exam: BP 122/70   Pulse 72   Ht 5\' 3"  (1.6 m)   Wt 128 lb (58.1 kg)   BMI 22.67 kg/m   Constitutional:  Well nourished. Alert and oriented, No acute distress. HEENT: [place] AT, mask in place  Trachea midline Cardiovascular: No clubbing, cyanosis, or edema. Respiratory: Normal respiratory effort, no increased work of breathing. GI: Abdomen is soft, non tender, non distended, no abdominal masses.  GU: No CVA tenderness.  No bladder fullness or masses.  Atrophic external genitalia, sparse pubic hair distribution, no lesions.  Urethral caruncle.  No urethral masses, tenderness and/or tenderness. No bladder fullness, tenderness or masses. Pale vagina mucosa, poor estrogen effect, no discharge, no lesions, poor pelvic support, grade III cystocele and no rectocele noted.  Cervix and uterus are surgically absent.  Anus and perineum are without rashes or lesions.    Skin: No rashes, bruises or suspicious lesions. Lymph: No inguinal adenopathy. Neurologic: Grossly intact, no focal deficits,  moving all 4 extremities. Psychiatric: Normal mood and affect.   Laboratory Data: Lab Results  Component Value Date   WBC 11.1 (H) 05/29/2019   HGB 12.3 05/29/2019   HCT 37.1 05/29/2019   MCV 91.4 05/29/2019   PLT 195 05/29/2019    Lab Results  Component Value Date   CREATININE 0.89 05/29/2019    Lab Results  Component Value Date   HGBA1C 5.3 01/15/2018    Lab Results  Component Value Date   TSH 0.838 01/15/2018       Component Value Date/Time   CHOL 144 12/15/2016 0459   HDL 47 12/15/2016 0459   CHOLHDL 3.1 12/15/2016 0459   VLDL 5 12/15/2016 0459   LDLCALC 92 12/15/2016 0459    Lab Results  Component Value Date   AST 23 05/29/2019   Lab Results  Component Value Date   ALT 24 05/29/2019    Urinalysis Component     Latest Ref Rng & Units 07/20/2019  Specific Gravity, UA     1.005 - 1.030 1.015  pH, UA     5.0 - 7.5 5.5  Color, UA     Yellow Yellow  Appearance Ur     Clear Cloudy (A)  Leukocytes,UA     Negative 2+ (A)  Protein,UA     Negative/Trace Trace (A)  Glucose, UA     Negative Negative  Ketones, UA     Negative Trace (A)  RBC, UA     Negative 1+ (A)  Bilirubin, UA     Negative Negative  Urobilinogen, Ur     0.2 - 1.0 mg/dL 0.2  Nitrite, UA     Negative Negative  Microscopic Examination      See below:   Component     Latest Ref Rng & Units 07/20/2019  WBC, UA     0 - 5 /hpf >30 (A)  RBC     0 - 2 /hpf 3-10 (A)  Epithelial Cells (non renal)     0 - 10 /hpf 0-10  Renal Epithel, UA     None seen /hpf 0-10 (A)  Bacteria, UA     None seen/Few Moderate (A)   I have reviewed the labs.   Pertinent Imaging: No imaging since last visit  Assessment & Plan:    1.  Dysuria - Urinalysis, Complete - CULTURE, URINE COMPREHENSIVE  2. Atrophic vaginitis -Continue to apply vaginal estrogen cream though may be difficult as [employer] is no longer making Premarin cream and Estrace vaginal cream may be cost prohibitive  3.  Microscopic  hematuria -Likely due to urinary tract infection -Urine culture and urine cytology are pending -We will continue to monitor  Return for Pending urine culture and urine cytology results.  These notes generated with voice recognition software. I apologize for typographical errors.  Michiel Cowboy, PA-C  [facility] [address]  [address] [place], [place] [address] [phone]

## 2019-07-21 LAB — URINALYSIS, COMPLETE
Bilirubin, UA: NEGATIVE
Glucose, UA: NEGATIVE
Nitrite, UA: NEGATIVE
Specific Gravity, UA: 1.015 (ref 1.005–1.030)
Urobilinogen, Ur: 0.2 mg/dL (ref 0.2–1.0)
pH, UA: 5.5 (ref 5.0–7.5)

## 2019-07-21 LAB — MICROSCOPIC EXAMINATION: WBC, UA: >30 /hpf — AB (ref 0–5)

## 2019-07-23 LAB — CULTURE, URINE COMPREHENSIVE

## 2019-07-25 ENCOUNTER — Telehealth: Payer: Self-pay | Admitting: Family Medicine

## 2019-07-25 MED ORDER — CIPROFLOXACIN HCL 250 MG PO TABS: 250.0000 mg | ORAL_TABLET | Freq: Two times a day (BID) | ORAL | 0 refills | Status: DC

## 2019-07-25 NOTE — Telephone Encounter (Signed)
Patient notified and voiced understanding. Cipro was sent to pharmacy.

## 2019-07-25 NOTE — Telephone Encounter (Signed)
-----   Message from Nori Riis, PA-C sent at 07/24/2019  8:02 AM EDT ----- Please let Mrs. Kortz know that her urine culture is positive for infection.  She needs to start Cipro 250 mg BID x 7 days.

## 2019-07-27 ENCOUNTER — Other Ambulatory Visit: Payer: Self-pay | Admitting: Urology

## 2019-08-02 ENCOUNTER — Telehealth: Payer: Self-pay

## 2019-08-02 ENCOUNTER — Telehealth: Payer: Self-pay | Admitting: Radiology

## 2019-08-02 NOTE — Telephone Encounter (Signed)
2 wk follow up appt made for Thursday 6/17.

## 2019-08-02 NOTE — Telephone Encounter (Signed)
-----   Message from Nori Riis, PA-C sent at 08/01/2019  5:20 PM EDT ----- I would like Mrs. Soden to follow-up in 2 weeks so that we may recheck her symptoms and UA to ensure the microscopic hematuria has cleared with the treating of her UTI.

## 2019-08-02 NOTE — Telephone Encounter (Signed)
Patient wants to know if cytology report has came in. Please advise.

## 2019-08-02 NOTE — Telephone Encounter (Signed)
LMOM to c/b to schedule appointment with Zara Council.

## 2019-08-03 NOTE — Telephone Encounter (Signed)
Please let Mrs. Kovack know that her urine cytology was negative.

## 2019-08-03 NOTE — Telephone Encounter (Signed)
Patient notified and voiced understanding.

## 2019-08-18 ENCOUNTER — Ambulatory Visit (INDEPENDENT_AMBULATORY_CARE_PROVIDER_SITE_OTHER): Payer: Medicare Other | Admitting: Urology

## 2019-08-18 ENCOUNTER — Encounter: Payer: Self-pay | Admitting: Urology

## 2019-08-18 ENCOUNTER — Other Ambulatory Visit: Payer: Self-pay

## 2019-08-18 VITALS — BP 167/76 | HR 66 | Ht 63.0 in | Wt 127.0 lb

## 2019-08-18 DIAGNOSIS — R21 Rash and other nonspecific skin eruption: Secondary | ICD-10-CM | POA: Diagnosis not present

## 2019-08-18 DIAGNOSIS — N952 Postmenopausal atrophic vaginitis: Secondary | ICD-10-CM | POA: Diagnosis not present

## 2019-08-18 DIAGNOSIS — R300 Dysuria: Secondary | ICD-10-CM | POA: Diagnosis not present

## 2019-08-18 DIAGNOSIS — R3129 Other microscopic hematuria: Secondary | ICD-10-CM

## 2019-08-18 MED ORDER — NYSTATIN-TRIAMCINOLONE 100000-0.1 UNIT/GM-% EX OINT: 1.0000 "application " | TOPICAL_OINTMENT | Freq: Two times a day (BID) | CUTANEOUS | 0 refills | Status: AC

## 2019-08-18 NOTE — Progress Notes (Signed)
08/18/2019 1:48 PM   Alicia Patterson 11/22/1930 [id number]  Referring provider: Derinda Late, MD [phone] [address] [facility] [place],  [place] [address]  Chief Complaint  Patient presents with  . Dysuria    HPI: Alicia Patterson is an 86 year old female with dysuria, atrophic vaginitis and rUTI's who presents today for follow up.  Dysuria/ rUTI's Has a history of being prescribed antibiotics from various physicians often without UA/urine culture data to support treatments.  She was found to have a UTI due to Klebsiella pneumonia that was resistant to ampicillin.  She was started on a culture appropriate antibiotic.  Urine cytology was negative 07/27/2019.  Patient denies any modifying or aggravating factors.  Patient denies any gross hematuria, dysuria or suprapubic/flank pain.  Patient denies any fevers, chills, nausea or vomiting.   Atrophic vaginitis She states she is applying the cream three nights weekly.  Today, her main complaint is vaginal itching.  She states the itching is so intense it keeps her up at night.  She has been using cornstarch and showering the area to reduce the pruritus.  She denied any vaginal discharge or vaginal bleeding.    PMH: Past Medical History:  Diagnosis Date  . Cancer ([place])   . Coronary artery disease   . GERD (gastroesophageal reflux disease)   . Glaucoma     Surgical History: Past Surgical History:  Procedure Laterality Date  . ABDOMINAL HYSTERECTOMY    . BREAST BIOPSY Left 05/22/2016   neg-ORGANIZING FAT NECROSIS. VASCULAR CALCS   . CHOLECYSTECTOMY    . LEFT HEART CATH AND CORONARY ANGIOGRAPHY N/A 12/15/2016   Procedure: LEFT HEART CATH AND CORONARY ANGIOGRAPHY;  Surgeon: Dionisio David, MD;  Location: [facility];  Service: Cardiovascular;  Laterality: N/A;  . TONSILLECTOMY      Home Medications:  Allergies as of 08/18/2019      Reactions   Amoxicillin-pot Clavulanate    Other  reaction(s): Unknown Pt states she is not allergic to this medication   Cefdinir    Other reaction(s): Other (See Comments)   Gatifloxacin    Other reaction(s): Unknown Other reaction(s): Other (See Comments)   Sulfa Antibiotics Rash   Sulfasalazine Rash      Medication List       Accurate as of August 18, 2019 11:59 PM. If you have any questions, ask your nurse or doctor.        STOP taking these medications   ciprofloxacin 250 MG tablet Commonly known as: Cipro Stopped by: Zara Council, PA-C     TAKE these medications   aspirin EC 81 MG tablet Take 81 mg by mouth daily.   atorvastatin 40 MG tablet Commonly known as: LIPITOR Take 1 tablet (40 mg total) by mouth daily at 6 PM.   brimonidine-timolol 0.2-0.5 % ophthalmic solution Commonly known as: COMBIGAN Place 1 drop into both eyes every 12 (twelve) hours.   carvedilol 3.125 MG tablet Commonly known as: COREG Take 3.125 mg by mouth 2 (two) times daily with a meal. What changed: Another medication with the same name was removed. Continue taking this medication, and follow the directions you see here. Changed by: Zara Council, PA-C   clobetasol ointment 0.05 % Commonly known as: TEMOVATE Apply 1 application topically every other day.   clopidogrel 75 MG tablet Commonly known as: PLAVIX Take 75 mg by mouth daily.   dorzolamide 2 % ophthalmic solution Commonly known as: TRUSOPT  Place 1 drop into both eyes daily.   estradiol 0.1 MG/GM vaginal cream Commonly known as: ESTRACE Apply a pea size amount Monday, Wed, Friday night   famotidine 40 MG tablet Commonly known as: PEPCID Take 40 mg by mouth at bedtime.   isosorbide mononitrate 30 MG 24 hr tablet Commonly known as: IMDUR Take 1 tablet by mouth daily in the afternoon.   latanoprost 0.005 % ophthalmic solution Commonly known as: XALATAN Place 1 drop into both eyes daily.   lisinopril 5 MG tablet Commonly known as: ZESTRIL Take 5 mg by mouth  daily.   Multi-Vitamins Tabs Take 1 tablet by mouth daily in the afternoon.   nystatin-triamcinolone ointment Commonly known as: MYCOLOG Apply 1 application topically 2 (two) times daily. Started by: Zara Council, PA-C   omeprazole 40 MG capsule Commonly known as: PRILOSEC Take 1 capsule by mouth daily in the afternoon.   PRESERVISION AREDS 2 PO Take 1 tablet by mouth daily in the afternoon.       Allergies:  Allergies  Allergen Reactions  . Amoxicillin-Pot Clavulanate     Other reaction(s): Unknown  Pt states she is not allergic to this medication  . Cefdinir     Other reaction(s): Other (See Comments)  . Gatifloxacin     Other reaction(s): Unknown Other reaction(s): Other (See Comments)  . Sulfa Antibiotics Rash  . Sulfasalazine Rash    Family History: Family History  Problem Relation Age of Onset  . Breast cancer Sister 15  . Breast cancer Other 50  . Prostate cancer Brother     Social History:  reports that she quit smoking about 42 years ago. She has never used smokeless tobacco. She reports that she does not drink alcohol and does not use drugs.  ROS: Pertinent ROS in HPI  Physical Exam: BP (!) 167/76   Pulse 66   Ht 5\' 3"  (1.6 m)   Wt 127 lb (57.6 kg)   BMI 22.50 kg/m   Constitutional:  Well nourished. Alert and oriented, No acute distress. HEENT: [place] AT, mask in place.  Trachea midline Cardiovascular: No clubbing, cyanosis, or edema. Respiratory: Normal respiratory effort, no increased work of breathing. GI: Abdomen is soft, non tender, non distended, no abdominal masses. Liver and spleen not palpable.  No hernias appreciated.  Stool sample for occult testing is not indicated.   GU: No CVA tenderness.  No bladder fullness or masses.  Atrophic external genitalia, sparse pubic hair distribution, no lesions.  Fused upper labia.  Excoriation marks on the labia and perineum.  Urethral caruncle noted.   No urethral masses, tenderness and/or tenderness.  No bladder fullness, tenderness or masses. Pale vagina mucosa, poor estrogen effect, no discharge, no lesions, poor pelvic support, grade II cystocele and no rectocele noted.   Anus and perineum are without rashes or lesions.    Lymph: No inguinal adenopathy. Neurologic: Grossly intact, no focal deficits, moving all 4 extremities. Psychiatric: Normal mood and affect.   Laboratory Data: Lab Results  Component Value Date   WBC 11.1 (H) 05/29/2019   HGB 12.3 05/29/2019   HCT 37.1 05/29/2019   MCV 91.4 05/29/2019   PLT 195 05/29/2019    Lab Results  Component Value Date   CREATININE 0.89 05/29/2019    Lab Results  Component Value Date   HGBA1C 5.3 01/15/2018    Lab Results  Component Value Date   TSH 0.838 01/15/2018       Component Value Date/Time   CHOL 144 12/15/2016  0459   HDL 47 12/15/2016 0459   CHOLHDL 3.1 12/15/2016 0459   VLDL 5 12/15/2016 0459   LDLCALC 92 12/15/2016 0459    Lab Results  Component Value Date   AST 23 05/29/2019   Lab Results  Component Value Date   ALT 24 05/29/2019    Urinalysis Component     Latest Ref Rng & Units 08/18/2019  Specific Gravity, UA     1.005 - 1.030 1.010  pH, UA     5.0 - 7.5 5.5  Color, UA     Yellow Yellow  Appearance Ur     Clear Cloudy (A)  Leukocytes,UA     Negative Trace (A)  Protein,UA     Negative/Trace Negative  Glucose, UA     Negative Negative  Ketones, UA     Negative Negative  RBC, UA     Negative Trace (A)  Bilirubin, UA     Negative Negative  Urobilinogen, Ur     0.2 - 1.0 mg/dL 0.2  Nitrite, UA     Negative Negative  Microscopic Examination      See below:   Component     Latest Ref Rng & Units 08/18/2019  WBC, UA     0 - 5 /hpf 0-5  RBC     0 - 2 /hpf 0-2  Epithelial Cells (non renal)     0 - 10 /hpf >10 (A)  Bacteria, UA     None seen/Few None seen  Yeast, UA     None seen Present (A)     I have reviewed the labs.   Pertinent Imaging: No imaging since last  visit  Assessment & Plan:    1. Vaginal rash As patient been constantly scratching the area it is difficult to evaluate the rash.  She was surprised that I saw a rash, so she was unable to give me history of what the area appeared like prior to the pruritis and scratching.   Prescribing Mycolog cream for two weeks and then exam  2.  Dysuria - Resolved  3. Atrophic vaginitis -Continue to apply vaginal estrogen cream though may be difficult as [employer] is no longer making Premarin cream and Estrace vaginal cream may be cost prohibitive  4.  Microscopic hematuria - Resolved - Urine cytology was negative  Return in about 2 weeks (around 09/01/2019) for recheck.  These notes generated with voice recognition software. I apologize for typographical errors.  Zara Council, PA-C  [facility] [address]  [place], [place] [address] [phone]  I spent 30 minutes on the day of the encounter to include pre-visit record review, face-to-face time with the patient, and post-visit ordering of tests.

## 2019-08-22 LAB — URINALYSIS, COMPLETE
Bilirubin, UA: NEGATIVE
Glucose, UA: NEGATIVE
Ketones, UA: NEGATIVE
Nitrite, UA: NEGATIVE
Protein,UA: NEGATIVE
Specific Gravity, UA: 1.01 (ref 1.005–1.030)
Urobilinogen, Ur: 0.2 mg/dL (ref 0.2–1.0)
pH, UA: 5.5 (ref 5.0–7.5)

## 2019-08-22 LAB — MICROSCOPIC EXAMINATION
Bacteria, UA: NONE SEEN
Epithelial Cells (non renal): >10 /hpf — AB (ref 0–10)

## 2019-08-31 NOTE — Progress Notes (Signed)
09/01/2019 8:10 AM   Alicia Patterson Jul 31, 1930 [id number]  Referring provider: Derinda Late, MD [phone] [facility] [place],  [place] [address]  Chief Complaint  Patient presents with  . Follow-up  . Urinary Frequency    HPI: Alicia Patterson is a 87 y.o. female with dysuria, atrophic vaginitis and rUTI's who presents today for follow up exam for a vaginal rash.   Dysuria/ rUTI's Has a history of being prescribed antibiotics from various physicians often without UA/urine culture data to support treatments.  She was found to have a UTI due to Klebsiella pneumonia that was resistant to ampicillin.  She was started on a culture appropriate antibiotic.  Urine cytology was negative 07/27/2019.  Patient denies any modifying or aggravating factors.  Patient denies any gross hematuria, dysuria or suprapubic/flank pain.  Patient denies any fevers, chills, nausea or vomiting.   Atrophic vaginitis She states she is applying the cream three nights weekly.  At her visit on 08/18/2019, she was found to have a vaginal rash and was given Mycolog cream.    Today, she is complaining of frequency, urgency and lower abdominal pain.  She attributes this to increasing her fluid intake and cleaning her carpets.  Her rash has cleared and she is no longer itching.  Her UA is unremarkable.  Patient denies any modifying or aggravating factors.  Patient denies any gross hematuria, dysuria or suprapubic/flank pain.  Patient denies any fevers, chills, nausea or vomiting.    PMH: Past Medical History:  Diagnosis Date  . Cancer ([place])   . Coronary artery disease   . GERD (gastroesophageal reflux disease)   . Glaucoma     Surgical History: Past Surgical History:  Procedure Laterality Date  . ABDOMINAL HYSTERECTOMY    . BREAST BIOPSY Left 05/22/2016   neg-ORGANIZING FAT NECROSIS. VASCULAR CALCS   . CHOLECYSTECTOMY    . LEFT HEART CATH AND CORONARY ANGIOGRAPHY  N/A 12/15/2016   Procedure: LEFT HEART CATH AND CORONARY ANGIOGRAPHY;  Surgeon: Dionisio David, MD;  Location: [facility];  Service: Cardiovascular;  Laterality: N/A;  . TONSILLECTOMY      Home Medications:  Allergies as of 09/01/2019      Reactions   Amoxicillin-pot Clavulanate    Other reaction(s): Unknown Pt states she is not allergic to this medication   Cefdinir    Other reaction(s): Other (See Comments)   Gatifloxacin    Other reaction(s): Unknown Other reaction(s): Other (See Comments)   Sulfa Antibiotics Rash   Sulfasalazine Rash      Medication List       Accurate as of September 01, 2019 11:59 PM. If you have any questions, ask your nurse or doctor.        STOP taking these medications   isosorbide mononitrate 30 MG 24 hr tablet Commonly known as: IMDUR Stopped by: Zara Council, PA-C     TAKE these medications   aspirin EC 81 MG tablet Take 81 mg by mouth daily.   atorvastatin 40 MG tablet Commonly known as: LIPITOR Take 1 tablet (40 mg total) by mouth daily at 6 PM.   brimonidine-timolol 0.2-0.5 % ophthalmic solution Commonly known as: COMBIGAN Place 1 drop into both eyes every 12 (twelve) hours.   carvedilol 3.125 MG tablet Commonly known as: COREG Take 3.125 mg by mouth 2 (two) times daily with a meal.   clobetasol ointment 0.05 % Commonly known as: TEMOVATE Apply 1 application  topically every other day.   clopidogrel 75 MG tablet Commonly known as: PLAVIX Take 75 mg by mouth daily.   dorzolamide 2 % ophthalmic solution Commonly known as: TRUSOPT Place 1 drop into both eyes daily.   estradiol 0.1 MG/GM vaginal cream Commonly known as: ESTRACE Apply a pea size amount Monday, Wed, Friday night   famotidine 40 MG tablet Commonly known as: PEPCID Take 40 mg by mouth at bedtime.   latanoprost 0.005 % ophthalmic solution Commonly known as: XALATAN Place 1 drop into both eyes daily.   lisinopril 5 MG tablet Commonly known as:  ZESTRIL Take 5 mg by mouth daily.   Multi-Vitamins Tabs Take 1 tablet by mouth daily in the afternoon.   nystatin-triamcinolone ointment Commonly known as: MYCOLOG Apply 1 application topically 2 (two) times daily.   omeprazole 40 MG capsule Commonly known as: PRILOSEC Take 1 capsule by mouth daily in the afternoon.   PRESERVISION AREDS 2 PO Take 1 tablet by mouth daily in the afternoon.       Allergies:  Allergies  Allergen Reactions  . Amoxicillin-Pot Clavulanate     Other reaction(s): Unknown  Pt states she is not allergic to this medication  . Cefdinir     Other reaction(s): Other (See Comments)  . Gatifloxacin     Other reaction(s): Unknown Other reaction(s): Other (See Comments)  . Sulfa Antibiotics Rash  . Sulfasalazine Rash    Family History: Family History  Problem Relation Age of Onset  . Breast cancer Sister 68  . Breast cancer Other 50  . Prostate cancer Brother     Social History:  reports that she quit smoking about 42 years ago. She has never used smokeless tobacco. She reports that she does not drink alcohol and does not use drugs.  ROS: Pertinent ROS in HPI  Physical Exam: BP 117/71   Pulse 65   Wt 127 lb (57.6 kg)   BMI 22.50 kg/m   Constitutional:  Well nourished. Alert and oriented, No acute distress. HEENT: [place] AT, moist mucus membranes.  Trachea midline Cardiovascular: No clubbing, cyanosis, or edema. Respiratory: Normal respiratory effort, no increased work of breathing. GI: Abdomen is soft, non tender, non distended, no abdominal masses. Liver and spleen not palpable.  No hernias appreciated.  Stool sample for occult testing is not indicated.   GU: No CVA tenderness.  No bladder fullness or masses.  Atrophic external genitalia, sparse pubic hair distribution, no lesions.  Normal urethral meatus, no lesions, no prolapse, no discharge.   No urethral masses, tenderness and/or tenderness. No bladder fullness, tenderness or masses. Pale  vagina mucosa, fair estrogen effect, no discharge, no lesions, fair pelvic support, grade II cystocele and no rectocele noted.  Anus and perineum are without rashes or lesions.    Skin: No rashes, bruises or suspicious lesions. Lymph: No inguinal adenopathy. Neurologic: Grossly intact, no focal deficits, moving all 4 extremities. Psychiatric: Normal mood and affect.   Laboratory Data: Lab Results  Component Value Date   WBC 11.1 (H) 05/29/2019   HGB 12.3 05/29/2019   HCT 37.1 05/29/2019   MCV 91.4 05/29/2019   PLT 195 05/29/2019    Lab Results  Component Value Date   CREATININE 0.89 05/29/2019    Lab Results  Component Value Date   HGBA1C 5.3 01/15/2018    Lab Results  Component Value Date   TSH 0.838 01/15/2018       Component Value Date/Time   CHOL 144 12/15/2016 0459   HDL  47 12/15/2016 0459   CHOLHDL 3.1 12/15/2016 0459   VLDL 5 12/15/2016 0459   LDLCALC 92 12/15/2016 0459    Lab Results  Component Value Date   AST 23 05/29/2019   Lab Results  Component Value Date   ALT 24 05/29/2019    Urinalysis Component     Latest Ref Rng & Units 09/01/2019  Specific Gravity, UA     1.005 - 1.030 1.010  pH, UA     5.0 - 7.5 5.0  Color, UA     Yellow Yellow  Appearance Ur     Clear Clear  Leukocytes,UA     Negative Negative  Protein,UA     Negative/Trace Negative  Glucose, UA     Negative Negative  Ketones, UA     Negative Negative  RBC, UA     Negative Negative  Bilirubin, UA     Negative Negative  Urobilinogen, Ur     0.2 - 1.0 mg/dL 0.2  Nitrite, UA     Negative Negative  Microscopic Examination      See below:   Component     Latest Ref Rng & Units 09/01/2019  WBC, UA     0 - 5 /hpf 0-5  RBC     0 - 2 /hpf 0-2  Epithelial Cells (non renal)     0 - 10 /hpf 0-10  Renal Epithel, UA     None seen /hpf 0-10 (A)  Casts     None seen /lpf Present (A)  Cast Type     N/A Granular casts (A)  Bacteria, UA     None seen/Few Few    I have  reviewed the labs.   Pertinent Imaging: No imaging since last visit  Assessment & Plan:    1. Vaginal rash Resolved   2.  Dysuria - Resolved  3. Atrophic vaginitis -Continue to apply vaginal estrogen cream 3 nights weekly   4.  Microscopic hematuria - Resolved - Urine cytology was negative  Return if symptoms worsen or fail to improve.  These notes generated with voice recognition software. I apologize for typographical errors.  Zara Council, PA-C  [facility] [address]  [place], [place] [address] [phone]  I spent 15 minutes on the day of the encounter to include pre-visit record review, face-to-face time with the patient, and post-visit ordering of tests.

## 2019-09-01 ENCOUNTER — Other Ambulatory Visit: Payer: Self-pay

## 2019-09-01 ENCOUNTER — Ambulatory Visit (INDEPENDENT_AMBULATORY_CARE_PROVIDER_SITE_OTHER): Payer: Medicare Other | Admitting: Urology

## 2019-09-01 ENCOUNTER — Encounter: Payer: Self-pay | Admitting: Urology

## 2019-09-01 VITALS — BP 117/71 | HR 65 | Wt 127.0 lb

## 2019-09-01 DIAGNOSIS — R21 Rash and other nonspecific skin eruption: Secondary | ICD-10-CM | POA: Diagnosis not present

## 2019-09-01 DIAGNOSIS — R399 Unspecified symptoms and signs involving the genitourinary system: Secondary | ICD-10-CM | POA: Diagnosis not present

## 2019-09-01 DIAGNOSIS — N952 Postmenopausal atrophic vaginitis: Secondary | ICD-10-CM

## 2019-09-01 LAB — URINALYSIS, COMPLETE
Bilirubin, UA: NEGATIVE
Glucose, UA: NEGATIVE
Ketones, UA: NEGATIVE
Leukocytes,UA: NEGATIVE
Nitrite, UA: NEGATIVE
Protein,UA: NEGATIVE
RBC, UA: NEGATIVE
Specific Gravity, UA: 1.01 (ref 1.005–1.030)
Urobilinogen, Ur: 0.2 mg/dL (ref 0.2–1.0)
pH, UA: 5 (ref 5.0–7.5)

## 2019-09-01 LAB — MICROSCOPIC EXAMINATION

## 2020-08-25 ENCOUNTER — Emergency Department
Admission: EM | Admit: 2020-08-25 | Discharge: 2020-08-25 | Disposition: A | Discharge status: Home / Self Care | Payer: Medicare Other | Attending: Emergency Medicine | Admitting: Emergency Medicine

## 2020-08-25 ENCOUNTER — Other Ambulatory Visit: Payer: Self-pay

## 2020-08-25 ENCOUNTER — Encounter: Payer: Self-pay | Admitting: Emergency Medicine

## 2020-08-25 ENCOUNTER — Emergency Department: Payer: Medicare Other

## 2020-08-25 DIAGNOSIS — Z87891 Personal history of nicotine dependence: Secondary | ICD-10-CM | POA: Insufficient documentation

## 2020-08-25 DIAGNOSIS — Y92000 Kitchen of unspecified non-institutional (private) residence as  the place of occurrence of the external cause: Secondary | ICD-10-CM | POA: Diagnosis not present

## 2020-08-25 DIAGNOSIS — M7989 Other specified soft tissue disorders: Secondary | ICD-10-CM | POA: Insufficient documentation

## 2020-08-25 DIAGNOSIS — W010XXA Fall on same level from slipping, tripping and stumbling without subsequent striking against object, initial encounter: Secondary | ICD-10-CM | POA: Insufficient documentation

## 2020-08-25 DIAGNOSIS — Z7902 Long term (current) use of antithrombotics/antiplatelets: Secondary | ICD-10-CM | POA: Diagnosis not present

## 2020-08-25 DIAGNOSIS — I110 Hypertensive heart disease with heart failure: Secondary | ICD-10-CM | POA: Diagnosis not present

## 2020-08-25 DIAGNOSIS — M25562 Pain in left knee: Secondary | ICD-10-CM | POA: Insufficient documentation

## 2020-08-25 DIAGNOSIS — Z7982 Long term (current) use of aspirin: Secondary | ICD-10-CM | POA: Diagnosis not present

## 2020-08-25 DIAGNOSIS — I5021 Acute systolic (congestive) heart failure: Secondary | ICD-10-CM | POA: Insufficient documentation

## 2020-08-25 DIAGNOSIS — Z859 Personal history of malignant neoplasm, unspecified: Secondary | ICD-10-CM | POA: Diagnosis not present

## 2020-08-25 DIAGNOSIS — Z79899 Other long term (current) drug therapy: Secondary | ICD-10-CM | POA: Diagnosis not present

## 2020-08-25 DIAGNOSIS — I2510 Atherosclerotic heart disease of native coronary artery without angina pectoris: Secondary | ICD-10-CM | POA: Diagnosis not present

## 2020-08-25 NOTE — ED Provider Notes (Signed)
[facility] Emergency Department Provider Note  ____________________________________________   None    (approximate)  I have reviewed the triage vital signs and the nursing notes.   HISTORY  Chief Complaint Fall and Knee Pain   HPI Alicia Patterson is a 87 y.o. female past medical history of CAD on ASA and Plavix, GERD, gout, and cancer who presents for assessment of some persistent soreness in her left knee and a little bit on the inside of the left ankle after a fall.  Just over a week ago when she turned in her home and tripped on something she is not sure what it was on her left knee.  She denies hitting her head or any LOC.  She states she is not anticoagulation on ASA and Plavix.  She is has not had since had any pain in her head, neck, chest, abdomen, back, upper extremities, right lower extremity or left hip only in the left knee and little bit in ankle.  No other recent falls or injuries.  She has not had any fevers, cough, nausea, vomiting, diarrhea, dysuria, rash or any other recent sick symptoms.  No other acute concerns at this time other than some persistent pain in her left knee and a "knot" on the back of the knee.         Past Medical History:  Diagnosis Date   Cancer [facility])    Coronary artery disease    GERD (gastroesophageal reflux disease)    Glaucoma     Patient Active Problem List   Diagnosis Date Noted   Chest pain 01/15/2018   Glaucoma 02/16/2017   Glaucoma of both eyes 02/16/2017   S/P drug eluting coronary stent placement 01/07/2017   ICD (implantable cardioverter-defibrillator), dual, in situ 63/89/3734   Acute systolic CHF (congestive heart failure) ([place]) 12/16/2016   CAD (coronary artery disease) 12/16/2016   NSTEMI (non-ST elevated myocardial infarction) ([place]) 12/15/2016   Recurrent UTI 03/20/2015   Urinary tract infection 03/20/2015   Small bowel obstruction ([place]) 12/29/2014   Esophageal reflux 11/10/2013   C2 cervical  fracture ([place]) 06/05/2011   Fracture, metacarpal, neck 05/30/2011    Past Surgical History:  Procedure Laterality Date   ABDOMINAL HYSTERECTOMY     BREAST BIOPSY Left 05/22/2016   neg-ORGANIZING FAT NECROSIS. VASCULAR CALCS    CHOLECYSTECTOMY     LEFT HEART CATH AND CORONARY ANGIOGRAPHY N/A 12/15/2016   Procedure: LEFT HEART CATH AND CORONARY ANGIOGRAPHY;  Surgeon: Dionisio David, MD;  Location: [place] [facility];  Service: Cardiovascular;  Laterality: N/A;   TONSILLECTOMY      Prior to Admission medications   Medication Sig Start Date End Date Taking? Authorizing Provider  aspirin EC 81 MG tablet Take 81 mg by mouth daily.    [provider]  atorvastatin (LIPITOR) 40 MG tablet Take 1 tablet (40 mg total) by mouth daily at 6 PM. 12/15/16   Epifanio Lesches, MD  brimonidine-timolol (COMBIGAN) 0.2-0.5 % ophthalmic solution Place 1 drop into both eyes every 12 (twelve) hours.    [provider]  carvedilol (COREG) 3.125 MG tablet Take 3.125 mg by mouth 2 (two) times daily with a meal.    [provider]  clobetasol ointment (TEMOVATE) 2.87 % Apply 1 application topically every other day. 11/06/16   [provider]  clopidogrel (PLAVIX) 75 MG tablet Take 75 mg by mouth daily.  02/13/17   [provider]  dorzolamide (TRUSOPT) 2 % ophthalmic solution Place 1  drop into both eyes daily. 08/31/15   [provider]  estradiol (ESTRACE) 0.1 MG/GM vaginal cream Apply a pea size amount Monday, Wed, Friday night 07/20/19   Hollice Espy, MD  famotidine (PEPCID) 40 MG tablet Take 40 mg by mouth at bedtime. 05/04/19   [provider]  latanoprost (XALATAN) 0.005 % ophthalmic solution Place 1 drop into both eyes daily.    [provider]  lisinopril (PRINIVIL,ZESTRIL) 5 MG tablet Take 5 mg by mouth daily.    [provider]  Multiple Vitamin (MULTI-VITAMINS) TABS Take 1 tablet by mouth daily in the afternoon.      [provider]  Multiple Vitamins-Minerals (PRESERVISION AREDS 2 PO) Take 1 tablet by mouth daily in the afternoon.    [provider]  nystatin-triamcinolone ointment (MYCOLOG) Apply 1 application topically 2 (two) times daily. 08/18/19   McGowan, Hunt Oris, PA-C  omeprazole (PRILOSEC) 40 MG capsule Take 1 capsule by mouth daily in the afternoon. 05/05/19   [provider]    Allergies Amoxicillin-pot clavulanate, Cefdinir, Gatifloxacin, Sulfa antibiotics, and Sulfasalazine  Family History  Problem Relation Age of Onset   Breast cancer Sister 34   Breast cancer Other 65   Prostate cancer Brother     Social History Social History   Tobacco Use   Smoking status: Former    Pack years: 0.00    Types: Cigarettes    Quit date: 1979    Years since quitting: 43.5   Smokeless tobacco: Never  Substance Use Topics   Alcohol use: No   Drug use: No    Review of Systems  Review of Systems  Constitutional:  Negative for chills and fever.  HENT:  Negative for sore throat.   Eyes:  Negative for pain.  Respiratory:  Negative for cough and stridor.   Cardiovascular:  Negative for chest pain.  Gastrointestinal:  Negative for vomiting.  Genitourinary:  Negative for dysuria.  Musculoskeletal:  Positive for joint pain (L knee, L ankle).  Skin:  Negative for rash.  Neurological:  Negative for seizures, loss of consciousness and headaches.  Psychiatric/Behavioral:  Negative for suicidal ideas.   All other systems reviewed and are negative.    ____________________________________________   PHYSICAL EXAM:  VITAL SIGNS: ED Triage Vitals  Enc Vitals Group     BP 08/25/20 0641 (!) 186/70     Pulse Rate 08/25/20 0641 69     Resp 08/25/20 0641 16     Temp 08/25/20 0641 97.8 F (36.6 C)     Temp Source 08/25/20 0641 Oral     SpO2 08/25/20 0641 98 %     Weight 08/25/20 0642 132 lb (59.9 kg)     Height 08/25/20 0642 5\' 3"  (1.6 m)     Head Circumference --       Peak Flow --      Pain Score 08/25/20 0651 8     Pain Loc --      Pain Edu? --      Excl. in [place]? --    Vitals:   08/25/20 0641  BP: (!) 186/70  Pulse: 69  Resp: 16  Temp: 97.8 F (36.6 C)  SpO2: 98%   Physical Exam Vitals and nursing note reviewed.  Constitutional:      General: She is not in acute distress.    Appearance: She is well-developed.  HENT:     Head: Normocephalic and atraumatic.     Right Ear: External ear normal.  Left Ear: External ear normal.     Nose: Nose normal.  Eyes:     Conjunctiva/sclera: Conjunctivae normal.  Cardiovascular:     Rate and Rhythm: Normal rate and regular rhythm.     Heart sounds: No murmur heard. Pulmonary:     Effort: Pulmonary effort is normal. No respiratory distress.     Breath sounds: Normal breath sounds.  Abdominal:     Palpations: Abdomen is soft.     Tenderness: There is no abdominal tenderness.  Musculoskeletal:     Cervical back: Neck supple.  Skin:    General: Skin is warm and dry.     Capillary Refill: Capillary refill takes less than 2 seconds.  Neurological:     Mental Status: She is alert and oriented to person, place, and time.  Psychiatric:        Mood and Affect: Mood normal.    Patient has full strength in her bilateral lower extremities.  2+ DP pulses.  Sensation intact to light touch all extremities.  There are some erythema tenderness over the left patella which is not displaced.  No large knee effusion.  There is some ecchymosis and bruising over the anterior leg and a little bit tenderness over the medial ankle which is no large effusion or deformity.  Patient is able to range her ankle but with some pain.  He is able to range the left knee with full range of motion and strength right and right although does complain of some pain when doing so.  No significant laxity on varus valgus or anterior posterior drawer testing.  There is a palpable mass behind left knee minimally fluctuant.  No overlying skin  changes ____________________________________________   LABS (all labs ordered are listed, but only abnormal results are displayed)  Labs Reviewed - No data to display ____________________________________________  EKG   ____________________________________________  RADIOLOGY  ED MD interpretation: US of  the left lower extremity shows no evidence of DVT or other clear abnormalities.  Plain films of the left knee and left ankle shows no fracture dislocation.  Official radiology report(s): DG Ankle Complete Left  Result Date: 08/25/2020 CLINICAL DATA:  Fall, hip knee and ankle. EXAM: LEFT ANKLE COMPLETE - 3+ VIEW COMPARISON:  None FINDINGS: Osteopenia. Muscular atrophy. No sign of acute fracture or dislocation. No significant soft tissue swelling. Ankle mortise is intact. IMPRESSION: 1. No acute fracture or dislocation. 2. Muscular atrophy in vascular calcifications. Electronically Signed   By: Zetta Bills M.D.   On: 08/25/2020 10:46   US Venous Img Lower Unilateral Left  Result Date: 08/25/2020 CLINICAL DATA:  87 year old female with leg pain EXAM: LEFT LOWER EXTREMITY VENOUS DOPPLER ULTRASOUND TECHNIQUE: Gray-scale sonography with graded compression, as well as color Doppler and duplex ultrasound were performed to evaluate the lower extremity deep venous systems from the level of the common femoral vein and including the common femoral, femoral, profunda femoral, popliteal and calf veins including the posterior tibial, peroneal and gastrocnemius veins when visible. The superficial great saphenous vein was also interrogated. Spectral Doppler was utilized to evaluate flow at rest and with distal augmentation maneuvers in the common femoral, femoral and popliteal veins. COMPARISON:  None. FINDINGS: Contralateral Common Femoral Vein: Respiratory phasicity is normal and symmetric with the symptomatic side. No evidence of thrombus. Normal compressibility. Common Femoral Vein: No evidence of  thrombus. Normal compressibility, respiratory phasicity and response to augmentation. Saphenofemoral Junction: No evidence of thrombus. Normal compressibility and flow on color Doppler imaging. Profunda Femoral  Vein: No evidence of thrombus. Normal compressibility and flow on color Doppler imaging. Femoral Vein: No evidence of thrombus. Normal compressibility, respiratory phasicity and response to augmentation. Popliteal Vein: No evidence of thrombus. Normal compressibility, respiratory phasicity and response to augmentation. Calf Veins: No evidence of thrombus. Normal compressibility and flow on color Doppler imaging. Superficial Great Saphenous Vein: No evidence of thrombus. Normal compressibility and flow on color Doppler imaging. Other Findings:  None. IMPRESSION: Sonographic survey of the left lower extremity negative for DVT Electronically Signed   By: Corrie Mckusick D.O.   On: 08/25/2020 10:37   DG Knee Complete 4 Views Left  Result Date: 08/25/2020 CLINICAL DATA:  [place] 3 days ago in her kitchen.  Left knee pain. EXAM: LEFT KNEE - COMPLETE 4+ VIEW COMPARISON:  None. FINDINGS: No fracture or bone lesion.  Skeletal structures are demineralized. Knee joint normally spaced and aligned. No degenerative/arthropathic change. No joint effusion. Mild anterior subcutaneous soft tissue edema. IMPRESSION: 1. No fracture or knee joint abnormality. 2. Mild anterior soft tissue edema. Electronically Signed   By: Lajean Manes M.D.   On: 08/25/2020 10:46    ____________________________________________   PROCEDURES  Procedure(s) performed (including Critical Care):  Procedures   ____________________________________________   INITIAL IMPRESSION / ASSESSMENT AND PLAN / ED COURSE      Patient presents with above-stated history exam for assessment of some persistent pain left knee after mechanical ground-level fall a little over a week ago as well as a "knot" behind the left knee.  She is not sure when the  knot developed.  She is hypertensive with BP of 186/70 with otherwise stable vital signs on room air.  She is neurovascular intact throughout both lower extremities and denies any other associated sick symptoms.  She does have some bruising with tenderness of the left knee and into the left ankle but has full strength and range of motion has been ambulating without significant difficulty.  With regard to the knot behind the knee differential includes possible Baker's cyst, DVT, lipoma.  No overlying skin changes fever or other historical or exam features to suggest cellulitis.  With regard to the tenderness over the knee and ankle suspect likely contusion over the patella and mild ankle sprain.  However patient has full range of motion and strength and has no fracture dislocation on imaging do not believe immobilization is necessary at this time.  She states the ankle pain is not bothering her at all. US of  the left lower extremity shows no evidence of DVT or other clear abnormalities.  Plain films of the left knee and left ankle shows no fracture dislocation.  Advised her to continue taking Tylenol as she has been and follow-up with her PCP.  Suspicion for occult significant injury.  Compartments are soft and there is no evidence of current syndrome.  The patient is stable for discharge with outpatient follow-up.    ____________________________________________   FINAL CLINICAL IMPRESSION(S) / ED DIAGNOSES  Final diagnoses:  Acute pain of left knee    Medications - No data to display   ED Discharge Orders     None        Note:  This document was prepared using Dragon voice recognition software and may include unintentional dictation errors.    Lucrezia Starch, MD 08/25/20 1113

## 2020-08-25 NOTE — ED Notes (Signed)
Pt states was taken off BP meds a year ago. States will follow up with primary provider about it if still high later today. States checks BP at home regularly.

## 2020-08-25 NOTE — ED Triage Notes (Signed)
Pt reports that a few days ago she got tripped up in her kitchen. She hit her left knee, she is able to bear weight and walks without difficulty. She is on blood thinners. She has purple bruising on her left lower leg. She also has a knot behind her left lower leg.
